# Patient Record
Sex: Female | Born: 2000 | Race: White | Hispanic: No | Smoking: Never smoker
Health system: Southern US, Community
[De-identification: ages and names within clinical notes are randomized; demographics above are authoritative.]

## PROBLEM LIST (undated history)

## (undated) ENCOUNTER — Inpatient Hospital Stay: Payer: Self-pay

## (undated) DIAGNOSIS — J45909 Unspecified asthma, uncomplicated: Secondary | ICD-10-CM

## (undated) HISTORY — PX: NO PAST SURGERIES: SHX2092

## (undated) HISTORY — DX: Unspecified asthma, uncomplicated: J45.909

---
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
## 2021-04-10 ENCOUNTER — Emergency Department
Admission: EM | Admit: 2021-04-10 | Discharge: 2021-04-10 | Disposition: A | Discharge status: Home / Self Care | Payer: Medicaid Other | Attending: Emergency Medicine | Admitting: Emergency Medicine

## 2021-04-10 ENCOUNTER — Emergency Department: Payer: Medicaid Other

## 2021-04-10 ENCOUNTER — Other Ambulatory Visit: Payer: Self-pay

## 2021-04-10 DIAGNOSIS — S161XXA Strain of muscle, fascia and tendon at neck level, initial encounter: Secondary | ICD-10-CM | POA: Insufficient documentation

## 2021-04-10 DIAGNOSIS — Z91040 Latex allergy status: Secondary | ICD-10-CM | POA: Insufficient documentation

## 2021-04-10 DIAGNOSIS — S0990XA Unspecified injury of head, initial encounter: Secondary | ICD-10-CM | POA: Diagnosis not present

## 2021-04-10 DIAGNOSIS — Y92410 Unspecified street and highway as the place of occurrence of the external cause: Secondary | ICD-10-CM | POA: Diagnosis not present

## 2021-04-10 DIAGNOSIS — S39012A Strain of muscle, fascia and tendon of lower back, initial encounter: Secondary | ICD-10-CM | POA: Insufficient documentation

## 2021-04-10 DIAGNOSIS — S199XXA Unspecified injury of neck, initial encounter: Secondary | ICD-10-CM | POA: Diagnosis present

## 2021-04-10 MED ORDER — IBUPROFEN 600 MG PO TABS: 600.0000 mg | ORAL_TABLET | Freq: Four times a day (QID) | ORAL | 0 refills | Status: DC | PRN

## 2021-04-10 MED ORDER — CYCLOBENZAPRINE HCL 5 MG PO TABS: 5.0000 mg | ORAL_TABLET | Freq: Three times a day (TID) | ORAL | 0 refills | Status: DC | PRN

## 2021-04-10 MED ORDER — METHOCARBAMOL 500 MG PO TABS
500.0000 mg | ORAL_TABLET | Freq: Once | ORAL | Status: AC
  Administered 2021-04-10: 500 mg via ORAL
  Filled 2021-04-10: qty 1

## 2021-04-10 MED ORDER — ACETAMINOPHEN 500 MG PO TABS
1000.0000 mg | ORAL_TABLET | Freq: Once | ORAL | Status: AC
  Administered 2021-04-10: 1000 mg via ORAL
  Filled 2021-04-10: qty 2
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
## 2021-04-10 NOTE — ED Notes (Signed)
Pt returned from radiology.

## 2021-04-10 NOTE — ED Notes (Signed)
Follow up pcp info provided alll questions answered , rx sent to pharm inof provided

## 2021-04-10 NOTE — Discharge Instructions (Addendum)
Please take ibuprofen and Tylenol for mild to moderate pain.  You may use muscle relaxer as needed for muscle tightness.  Apply ice to the neck and lower back muscles 20 minutes every hour for the next 2 to 3 days then you may transition to heat.  Return to the ER for any worsening symptoms or urgent changes in health.

## 2021-09-02 ENCOUNTER — Other Ambulatory Visit: Payer: Self-pay

## 2021-09-02 ENCOUNTER — Emergency Department
Admission: EM | Admit: 2021-09-02 | Discharge: 2021-09-02 | Disposition: A | Discharge status: Home / Self Care | Payer: Medicaid Other | Attending: Emergency Medicine | Admitting: Emergency Medicine

## 2021-09-02 ENCOUNTER — Emergency Department: Payer: Medicaid Other

## 2021-09-02 DIAGNOSIS — R519 Headache, unspecified: Secondary | ICD-10-CM | POA: Insufficient documentation

## 2021-09-02 DIAGNOSIS — D72829 Elevated white blood cell count, unspecified: Secondary | ICD-10-CM | POA: Insufficient documentation

## 2021-09-02 DIAGNOSIS — Z20822 Contact with and (suspected) exposure to covid-19: Secondary | ICD-10-CM | POA: Insufficient documentation

## 2021-09-02 DIAGNOSIS — E860 Dehydration: Secondary | ICD-10-CM | POA: Diagnosis not present

## 2021-09-02 DIAGNOSIS — R509 Fever, unspecified: Secondary | ICD-10-CM | POA: Diagnosis present

## 2021-09-02 DIAGNOSIS — J209 Acute bronchitis, unspecified: Secondary | ICD-10-CM | POA: Insufficient documentation

## 2021-09-02 LAB — CBC WITH DIFFERENTIAL/PLATELET
Abs Immature Granulocytes: 0.03 10*3/uL (ref 0.00–0.07)
Basophils Absolute: 0 10*3/uL (ref 0.0–0.1)
Basophils Relative: 0 %
Eosinophils Absolute: 0 10*3/uL (ref 0.0–0.5)
Eosinophils Relative: 0 %
HCT: 46.1 % — ABNORMAL HIGH (ref 36.0–46.0)
Hemoglobin: 15.4 g/dL — ABNORMAL HIGH (ref 12.0–15.0)
Immature Granulocytes: 0 %
Lymphocytes Relative: 5 %
Lymphs Abs: 0.7 10*3/uL (ref 0.7–4.0)
MCH: 30.4 pg (ref 26.0–34.0)
MCHC: 33.4 g/dL (ref 30.0–36.0)
MCV: 91.1 fL (ref 80.0–100.0)
Monocytes Absolute: 0.8 10*3/uL (ref 0.1–1.0)
Monocytes Relative: 7 %
Neutro Abs: 11.3 10*3/uL — ABNORMAL HIGH (ref 1.7–7.7)
Neutrophils Relative %: 88 %
Platelets: 236 10*3/uL (ref 150–400)
RBC: 5.06 MIL/uL (ref 3.87–5.11)
RDW: 12.6 % (ref 11.5–15.5)
WBC: 12.8 10*3/uL — ABNORMAL HIGH (ref 4.0–10.5)
nRBC: 0 % (ref 0.0–0.2)

## 2021-09-02 LAB — BASIC METABOLIC PANEL
Anion gap: 13 (ref 5–15)
BUN: 13 mg/dL (ref 6–20)
CO2: 22 mmol/L (ref 22–32)
Calcium: 9.4 mg/dL (ref 8.9–10.3)
Chloride: 98 mmol/L (ref 98–111)
Creatinine, Ser: 0.61 mg/dL (ref 0.44–1.00)
GFR, Estimated: >60 mL/min (ref >60)
Glucose, Bld: 84 mg/dL (ref 70–99)
Potassium: 3.6 mmol/L (ref 3.5–5.1)
Sodium: 133 mmol/L — ABNORMAL LOW (ref 135–145)

## 2021-09-02 LAB — URINALYSIS, ROUTINE W REFLEX MICROSCOPIC
Bilirubin Urine: NEGATIVE
Glucose, UA: NEGATIVE mg/dL
Hgb urine dipstick: NEGATIVE
Ketones, ur: 80 mg/dL — AB
Leukocytes,Ua: NEGATIVE
Nitrite: NEGATIVE
Protein, ur: NEGATIVE mg/dL
Specific Gravity, Urine: 1.023 (ref 1.005–1.030)
pH: 5 (ref 5.0–8.0)

## 2021-09-02 LAB — RESP PANEL BY RT-PCR (FLU A&B, COVID) ARPGX2
Influenza A by PCR: NEGATIVE
Influenza B by PCR: NEGATIVE
SARS Coronavirus 2 by RT PCR: NEGATIVE

## 2021-09-02 LAB — GROUP A STREP BY PCR: Group A Strep by PCR: NOT DETECTED

## 2021-09-02 LAB — MONONUCLEOSIS SCREEN: Mono Screen: NEGATIVE

## 2021-09-02 MED ORDER — ONDANSETRON 4 MG PO TBDP: 4.0000 mg | ORAL_TABLET | Freq: Three times a day (TID) | ORAL | 0 refills | Status: DC | PRN

## 2021-09-02 MED ORDER — ONDANSETRON HCL 4 MG/2ML IJ SOLN
4.0000 mg | Freq: Once | INTRAMUSCULAR | Status: AC
  Administered 2021-09-02: 4 mg via INTRAVENOUS
  Filled 2021-09-02: qty 2

## 2021-09-02 MED ORDER — SODIUM CHLORIDE 0.9 % IV BOLUS
1000.0000 mL | Freq: Once | INTRAVENOUS | Status: AC
  Administered 2021-09-02: 1000 mL via INTRAVENOUS

## 2021-09-02 MED ORDER — AZITHROMYCIN 250 MG PO TABS: ORAL_TABLET | ORAL | 0 refills | Status: DC

## 2021-09-02 NOTE — ED Notes (Addendum)
Pt to ED for states she feels tired and dehydrated. Has been feeling weak with HA for 3 days. This morning at 0300 woke up and vomited. Has vomited 3 times this morning. Has had diarrhea for 2 days. Had negative home covid test yesterday. Pt took T yesterday, was 101.2.  ?Ambulatory to ED room. Pulse is 118 after resting in bed. Was 130 right after walking to room. ? ?Pt states she also wants to get "checked out" for chest tightness when lays supine ("it hurts to breathe"). ?

## 2021-09-02 NOTE — ED Notes (Signed)
Patient transported to X-ray 

## 2021-09-02 NOTE — ED Provider Notes (Signed)
? ?[facility] ?Provider Note ? ? ? Event Date/Time  ? First MD Initiated Contact with Patient 09/02/21 0840   ?  (approximate) ? ? ?History  ? ?Fever ? ? ?HPI ? ?Kim Hawkins is a 21 y.o. female healthy female presents emergency department with fever, chills, cough/congestion/sore throat and vomiting.  Patient states she feels dehydrated.  Has a headache.  Symptoms have been ongoing for 3 days.  Had negative home COVID test.  Denies chest pain/shortness of breath ? ?  ? ? ?Physical Exam  ? ?Triage Vital Signs: ?ED Triage Vitals  ?Enc Vitals Group  ?   BP 09/02/21 0835 130/82  ?   Pulse Rate 09/02/21 0835 (!) 120  ?   Resp 09/02/21 0835 18  ?   Temp 09/02/21 0835 100 ?F (37.8 ?C)  ?   Temp Source 09/02/21 0835 Oral  ?   SpO2 09/02/21 0835 98 %  ?   Weight --   ?   Height --   ?   Head Circumference --   ?   Peak Flow --   ?   Pain Score 09/02/21 0828 10  ?   Pain Loc --   ?   Pain Edu? --   ?   Excl. in GC? --   ? ? ?Most recent vital signs: ?Vitals:  ? 09/02/21 0930 09/02/21 1119  ?BP:  116/66  ?Pulse: (!) 110 (!) 116  ?Resp: 18 16  ?Temp:  98.9 ?F (37.2 ?C)  ?SpO2: 98% 99%  ? ? ? ?General: Awake, no distress.   ?CV:  Good peripheral perfusion. regular rate and  rhythm ?Resp:  Normal effort. Lungs CTA ?Abd:  No distention.   ?Other:  Throat is red and swollen posteriorly ? ? ?ED Results / Procedures / Treatments  ? ?Labs ?(all labs ordered are listed, but only abnormal results are displayed) ?Labs Reviewed  ?BASIC METABOLIC PANEL - Abnormal; Notable for the following components:  ?    Result Value  ? Sodium 133 (*)   ? All other components within normal limits  ?CBC WITH DIFFERENTIAL/PLATELET - Abnormal; Notable for the following components:  ? WBC 12.8 (*)   ? Hemoglobin 15.4 (*)   ? HCT 46.1 (*)   ? Neutro Abs 11.3 (*)   ? All other components within normal limits  ?URINALYSIS, ROUTINE W REFLEX MICROSCOPIC - Abnormal; Notable for the following components:  ? Color, Urine YELLOW (*)   ?  APPearance HAZY (*)   ? Ketones, ur 80 (*)   ? All other components within normal limits  ?GROUP A STREP BY PCR  ?RESP PANEL BY RT-PCR (FLU A&B, COVID) [id number]  ?MONONUCLEOSIS SCREEN  ? ? ? ?EKG ? ? ? ? ?RADIOLOGY ? ? ? ? ?PROCEDURES: ? ? ?Procedures ? ? ?MEDICATIONS ORDERED IN ED: ?Medications  ?sodium chloride 0.9 % bolus 1,000 mL (0 mLs Intravenous Stopped 09/02/21 1029)  ?ondansetron [facility]) injection 4 mg (4 mg Intravenous Given 09/02/21 0857)  ?sodium chloride 0.9 % bolus 1,000 mL (0 mLs Intravenous Stopped 09/02/21 1311)  ? ? ? ?IMPRESSION / MDM / ASSESSMENT AND PLAN / ED COURSE  ?I reviewed the triage vital signs and the nursing notes. ?             ?               ? ?Differential diagnosis includes, but is not limited to, strep throat, COVID covid influenza covid CAP, dehydration, gastroenteritis ? ?  CBC, basic metabolic panel, COVID/flu test, strep test ordered, UA ? ?Due to the patient being tachycardic at 126 we will give her IV fluids. ? ?Patient's labs are reassuring, her CBC has mild elevation of WBC of 12.8, basic metabolic panel was normal, urinalysis has 80 ketones indicating dehydration, her respiratory panel, strep test, and monotest are all negative ? ?Patient was given 2 bags of normal saline while here in the ED.  She will be treated for acute bronchitis and dehydration.  She was given a prescription for azithromycin and Zofran for nausea/vomiting.  She is to follow-up with her regular doctor if not improved 3 days.  Return emergency department worsening.  Patient is in agreement treatment plan.  She was discharged stable condition.  She does not have any risk factors that prevent me from discharging her.  She appears to be stable as her heart rate has returned to normal. ? ? ? ?  ? ? ?FINAL CLINICAL IMPRESSION(S) / ED DIAGNOSES  ? ?Final diagnoses:  ?Acute bronchitis, unspecified organism  ?Dehydration  ? ? ? ?Rx / DC Orders  ? ?ED Discharge Orders   ? ?      Ordered  ?  azithromycin (ZITHROMAX  Z-PAK) 250 MG tablet       ? 09/02/21 1302  ?  ondansetron (ZOFRAN-ODT) 4 MG disintegrating tablet  Every 8 hours PRN       ? 09/02/21 1302  ? ?  ?  ? ?  ? ? ? ?Note:  This document was prepared using Dragon voice recognition software and may include unintentional dictation errors. ? ?  ?Faythe Ghee, PA-C ?09/02/21 1441 ? ?  ?Sharyn Creamer, MD ?09/02/21 1533 ? ?

## 2021-09-02 NOTE — Discharge Instructions (Signed)
Follow-up with your regular doctor for improving 3 days.  Return if worsening.  Take medication as prescribed. ?

## 2021-09-02 NOTE — ED Triage Notes (Signed)
Pt comes with c/o fever chills and body aches for few days. Pt states neg covid test at home. ?

## 2021-09-02 NOTE — ED Notes (Signed)
Reminded about urine sample needs ?

## 2021-09-02 NOTE — ED Notes (Signed)
Back from X/R.

## 2022-01-10 ENCOUNTER — Encounter: Payer: Self-pay | Admitting: Emergency Medicine

## 2022-01-10 ENCOUNTER — Emergency Department
Admission: EM | Admit: 2022-01-10 | Discharge: 2022-01-10 | Discharge status: Against Medical Advice | Payer: Medicaid Other | Attending: Emergency Medicine | Admitting: Emergency Medicine

## 2022-01-10 ENCOUNTER — Other Ambulatory Visit: Payer: Self-pay

## 2022-01-10 DIAGNOSIS — R21 Rash and other nonspecific skin eruption: Secondary | ICD-10-CM | POA: Diagnosis present

## 2022-01-10 DIAGNOSIS — R2232 Localized swelling, mass and lump, left upper limb: Secondary | ICD-10-CM | POA: Diagnosis not present

## 2022-01-10 DIAGNOSIS — Z5321 Procedure and treatment not carried out due to patient leaving prior to being seen by health care provider: Secondary | ICD-10-CM | POA: Insufficient documentation

## 2022-01-10 DIAGNOSIS — F909 Attention-deficit hyperactivity disorder, unspecified type: Secondary | ICD-10-CM | POA: Insufficient documentation

## 2022-01-10 NOTE — ED Triage Notes (Signed)
Pt called x's 3, no response ?

## 2022-01-10 NOTE — ED Triage Notes (Signed)
Pt reports washed her car today and when she was done she had some swelling to her left arm. Pt with 3 small red bumps to left forearm.

## 2022-01-10 NOTE — ED Triage Notes (Signed)
Pt called from [facility] to treatment room, no response 

## 2022-01-12 ENCOUNTER — Ambulatory Visit
Admission: EM | Admit: 2022-01-12 | Discharge: 2022-01-12 | Disposition: A | Discharge status: Home / Self Care | Payer: Medicaid Other | Attending: Emergency Medicine | Admitting: Emergency Medicine

## 2022-01-12 DIAGNOSIS — N390 Urinary tract infection, site not specified: Secondary | ICD-10-CM | POA: Diagnosis present

## 2022-01-12 LAB — URINALYSIS, ROUTINE W REFLEX MICROSCOPIC
Bilirubin Urine: NEGATIVE
Glucose, UA: NEGATIVE mg/dL
Ketones, ur: NEGATIVE mg/dL
Nitrite: NEGATIVE
Protein, ur: NEGATIVE mg/dL
Specific Gravity, Urine: 1.015 (ref 1.005–1.030)
pH: 7.5 (ref 5.0–8.0)

## 2022-01-12 LAB — URINALYSIS, MICROSCOPIC (REFLEX): WBC, UA: >50 WBC/hpf (ref 0–5)

## 2022-01-12 MED ORDER — NITROFURANTOIN MONOHYD MACRO 100 MG PO CAPS: 100.0000 mg | ORAL_CAPSULE | Freq: Two times a day (BID) | ORAL | 0 refills | Status: DC

## 2022-01-12 MED ORDER — PHENAZOPYRIDINE HCL 200 MG PO TABS: 200.0000 mg | ORAL_TABLET | Freq: Three times a day (TID) | ORAL | 0 refills | Status: DC

## 2022-01-12 NOTE — ED Provider Notes (Signed)
[facility]    CSN: [id number] Arrival date & time: 01/12/22  1305      History   Chief Complaint Chief Complaint  Patient presents with   Urinary Tract Infection    HPI Kim Hawkins is a 21 y.o. female.   HPI  21 year old female here for evaluation of urinary complaints.  Patient reports that for the past week she has been experiencing burning with urination and urinary frequency.  She is also been having sharp pain in her abdomen that goes down to her groin.  This is not associated with fever, nausea or vomiting, blood in her urine, urinary urgency, low back pain, vaginal discharge, or vaginal itching.  History reviewed. No pertinent past medical history.  There are no problems to display for this patient.   History reviewed. No pertinent surgical history.  OB History     Gravida  1   Para      Term      Preterm      AB      Living         SAB      IAB      Ectopic      Multiple      Live Births               Home Medications    Prior to Admission medications   Medication Sig Start Date End Date Taking? Authorizing Provider  albuterol (VENTOLIN HFA) 108 (90 Base) MCG/ACT inhaler Inhale into the lungs. 01/26/21  Yes [provider]  fluticasone-salmeterol (ADVAIR HFA) 115-21 MCG/ACT inhaler Inhale into the lungs. 01/21/21 01/21/22 Yes [provider]  nitrofurantoin, macrocrystal-monohydrate, (MACROBID) 100 MG capsule Take 1 capsule (100 mg total) by mouth 2 (two) times daily. 01/12/22  Yes Becky Augusta, NP  phenazopyridine (PYRIDIUM) 200 MG tablet Take 1 tablet (200 mg total) by mouth 3 (three) times daily. 01/12/22  Yes Becky Augusta, NP    Family History History reviewed. No pertinent family history.  Social History Social History   Tobacco Use   Smoking status: Never   Smokeless tobacco: Never  Vaping Use   Vaping Use: Every day  Substance Use Topics   Alcohol use: No   Drug use: No     Allergies    Penicillins, Cocos nucifera, and Latex   Review of Systems Review of Systems  Constitutional:  Negative for fever.  Gastrointestinal:  Positive for abdominal pain. Negative for nausea and vomiting.  Genitourinary:  Positive for dysuria and frequency. Negative for hematuria, urgency, vaginal discharge and vaginal pain.  Musculoskeletal:  Negative for back pain.  Hematological: Negative.   Psychiatric/Behavioral: Negative.       Physical Exam Triage Vital Signs ED Triage Vitals  Enc Vitals Group     BP 01/12/22 1357 111/71     Pulse Rate 01/12/22 1357 75     Resp 01/12/22 1357 18     Temp 01/12/22 1357 98.5 F (36.9 C)     Temp Source 01/12/22 1357 Oral     SpO2 01/12/22 1357 100 %     Weight 01/12/22 1354 90 lb 6.4 oz (41 kg)     Height --      Head Circumference --      Peak Flow --      Pain Score 01/12/22 1354 6     Pain Loc --      Pain Edu? --      Excl. in GC? --  No data found.  Updated Vital Signs BP 111/71 (BP Location: Left Arm)   Pulse 75   Temp 98.5 F (36.9 C) (Oral)   Resp 18   Wt 90 lb 6.4 oz (41 kg)   LMP 01/12/2022 (Exact Date)   SpO2 100%   BMI 17.66 kg/m   Visual Acuity Right Eye Distance:   Left Eye Distance:   Bilateral Distance:    Right Eye Near:   Left Eye Near:    Bilateral Near:     Physical Exam Vitals and nursing note reviewed.  Constitutional:      Appearance: Normal appearance. She is not ill-appearing.  HENT:     Head: Normocephalic and atraumatic.  Cardiovascular:     Rate and Rhythm: Normal rate and regular rhythm.     Pulses: Normal pulses.     Heart sounds: Normal heart sounds. No murmur heard.    No friction rub. No gallop.  Pulmonary:     Effort: Pulmonary effort is normal.     Breath sounds: Normal breath sounds. No wheezing, rhonchi or rales.  Abdominal:     General: Abdomen is flat. Bowel sounds are normal.     Palpations: Abdomen is soft.     Tenderness: There is abdominal tenderness. There is no  right CVA tenderness, left CVA tenderness, guarding or rebound.  Skin:    General: Skin is warm and dry.     Capillary Refill: Capillary refill takes less than 2 seconds.     Findings: No erythema or rash.  Neurological:     General: No focal deficit present.     Mental Status: She is alert and oriented to person, place, and time.  Psychiatric:        Mood and Affect: Mood normal.        Behavior: Behavior normal.        Thought Content: Thought content normal.        Judgment: Judgment normal.      UC Treatments / Results  Labs (all labs ordered are listed, but only abnormal results are displayed) Labs Reviewed  URINALYSIS, ROUTINE W REFLEX MICROSCOPIC - Abnormal; Notable for the following components:      Result Value   APPearance HAZY (*)    Hgb urine dipstick SMALL (*)    Leukocytes,Ua LARGE (*)    All other components within normal limits  URINALYSIS, MICROSCOPIC (REFLEX) - Abnormal; Notable for the following components:   Bacteria, UA MANY (*)    All other components within normal limits  URINE CULTURE    EKG   Radiology No results found.  Procedures Procedures (including critical care time)  Medications Ordered in UC Medications - No data to display  Initial Impression / Assessment and Plan / UC Course  I have reviewed the triage vital signs and the nursing notes.  Pertinent labs & imaging results that were available during my care of the patient were reviewed by me and considered in my medical decision making (see chart for details).  Patient is a pleasant, nontoxic-appearing 21 year old female here for evaluation of abdominal pain that is associated with burning with urination and urinary frequency has been going on for the past week.  No hematuria, nausea or vomiting, or fever.  Patient denies any vaginal discharge or itching.  Physical exam reveals a benign cardiopulmonary exam revealing S1-S2 heart sounds with regular rate and rhythm and lung sounds that  are clear to auscultation all fields.  No CVA tenderness on exam.  Abdomen is soft, flat, with positive bowel sounds in all 4 quadrants.  Patient does have significant midline abdominal tenderness in both the epigastrium and suprapubically.  Urinalysis was collected at triage and is pending.  Urinalysis shows small hemoglobin and large leukocyte esterase.  It is negative for nitrates, or protein.  Specific Rampey is normal at 1.015.  Reflex microscopy shows greater than 50 WBCs with many bacteria and WBC clumps.  Patient does have 6-10 squamous epithelials.  I will send urine for culture.   Final Clinical Impressions(s) / UC Diagnoses   Final diagnoses:  Lower urinary tract infectious disease     Discharge Instructions      Take the Macrobid twice daily for 5 days with food for treatment of urinary tract infection.  Use the Pyridium every 8 hours as needed for urinary discomfort.  This will turn your urine a bright red-orange.  Increase your oral fluid intake so that you increase your urine production and or flushing your urinary system.  Take an over-the-counter probiotic, such as Culturelle-Align-Activia, 1 hour after each dose of antibiotic to prevent diarrhea or yeast infections from forming.  We will culture urine and change the antibiotics if necessary.  If you develop worsening abdominal pain or nausea and vomiting and cannot keep down medications or fluids you need to go to the emergency department for evaluation.     ED Prescriptions     Medication Sig Dispense Auth. Provider   nitrofurantoin, macrocrystal-monohydrate, (MACROBID) 100 MG capsule Take 1 capsule (100 mg total) by mouth 2 (two) times daily. 10 capsule Margarette Canada, NP   phenazopyridine (PYRIDIUM) 200 MG tablet Take 1 tablet (200 mg total) by mouth 3 (three) times daily. 6 tablet Margarette Canada, NP      PDMP not reviewed this encounter.   Margarette Canada, NP 01/12/22 1425

## 2022-01-12 NOTE — Discharge Instructions (Addendum)
Take the Macrobid twice daily for 5 days with food for treatment of urinary tract infection.  Use the Pyridium every 8 hours as needed for urinary discomfort.  This will turn your urine a bright red-orange.  Increase your oral fluid intake so that you increase your urine production and or flushing your urinary system.  Take an over-the-counter probiotic, such as Culturelle-Align-Activia, 1 hour after each dose of antibiotic to prevent diarrhea or yeast infections from forming.  We will culture urine and change the antibiotics if necessary.  If you develop worsening abdominal pain or nausea and vomiting and cannot keep down medications or fluids you need to go to the emergency department for evaluation.

## 2022-01-12 NOTE — ED Triage Notes (Signed)
Pt c/o possible UTI x1week  Pt states that it feels like her bladder is 100lbs. Pt states that she is constantly moving at work and it has left her doubled over due to abdominal pain.   Pt states that she is having sharp pain in her abdomen that goes to the groin and burning while urinating.   Pt asks for a doctors note.

## 2022-01-15 ENCOUNTER — Telehealth (HOSPITAL_COMMUNITY): Payer: Self-pay | Admitting: Emergency Medicine

## 2022-01-15 LAB — URINE CULTURE: Culture: >=100000 — AB

## 2022-01-15 MED ORDER — SULFAMETHOXAZOLE-TRIMETHOPRIM 800-160 MG PO TABS: 1.0000 | ORAL_TABLET | Freq: Two times a day (BID) | ORAL | 0 refills | Status: AC
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
## 2023-05-24 ENCOUNTER — Emergency Department
Admission: EM | Admit: 2023-05-24 | Discharge: 2023-05-24 | Disposition: A | Discharge status: Home / Self Care | Payer: Medicaid Other | Attending: Emergency Medicine | Admitting: Emergency Medicine

## 2023-05-24 ENCOUNTER — Other Ambulatory Visit: Payer: Self-pay

## 2023-05-24 DIAGNOSIS — Z3A00 Weeks of gestation of pregnancy not specified: Secondary | ICD-10-CM | POA: Diagnosis not present

## 2023-05-24 DIAGNOSIS — O219 Vomiting of pregnancy, unspecified: Secondary | ICD-10-CM | POA: Insufficient documentation

## 2023-05-24 LAB — URINALYSIS, ROUTINE W REFLEX MICROSCOPIC
Bilirubin Urine: NEGATIVE
Glucose, UA: NEGATIVE mg/dL
Hgb urine dipstick: NEGATIVE
Ketones, ur: 80 mg/dL — AB
Leukocytes,Ua: NEGATIVE
Nitrite: NEGATIVE
Protein, ur: 30 mg/dL — AB
Specific Gravity, Urine: 1.025 (ref 1.005–1.030)
pH: 5 (ref 5.0–8.0)

## 2023-05-24 LAB — POC URINE PREG, ED: Preg Test, Ur: POSITIVE — AB

## 2023-05-24 MED ORDER — ONDANSETRON 4 MG PO TBDP
4.0000 mg | ORAL_TABLET | Freq: Once | ORAL | Status: AC
  Administered 2023-05-24: 4 mg via ORAL
  Filled 2023-05-24: qty 1

## 2023-05-24 MED ORDER — ONDANSETRON HCL 4 MG PO TABS: 4.0000 mg | ORAL_TABLET | Freq: Four times a day (QID) | ORAL | 0 refills | Status: AC | PRN

## 2023-05-24 NOTE — ED Notes (Signed)
Lab specimens collected in Error. Pt refusing blood draw and sts " I am not getting stuck by a needle" RN notified provider.

## 2023-05-24 NOTE — Discharge Instructions (Addendum)
You were seen for your vomiting in pregnancy.  I sent a prescription for nausea medicine to your pharmacy.  You did not wish to have lead work performed today.  Please follow-up with OB/GYN for further evaluation.  Return to the ER for new or worsening symptoms.

## 2023-05-24 NOTE — ED Notes (Addendum)
Pt requesting to wait on blood draw until she is in room. Pt states she gets nervous when getting it done and wants to wait.

## 2023-05-24 NOTE — ED Triage Notes (Signed)
Pt comes with c/o vomiting that started at 3am. Pt states she is preg but not sure how far along. Pt denies any bleeding or belly pain.

## 2023-05-24 NOTE — ED Provider Notes (Signed)
[facility] Provider Note    Event Date/Time   First MD Initiated Contact with Patient 05/24/23 1235     (approximate)   History   Emesis   HPI  Kim Hawkins is a 22 year old female presenting to the emergency department for evaluation of vomiting.  Reports positive home pregnancy test, unsure how far along she is.  She denies abdominal pain, vaginal bleeding.     Physical Exam   Triage Vital Signs: ED Triage Vitals  Encounter Vitals Group     BP 05/24/23 0838 131/87     Systolic BP Percentile --      Diastolic BP Percentile --      Pulse Rate 05/24/23 0838 (!) 120     Resp 05/24/23 0838 18     Temp 05/24/23 0838 98 F (36.7 C)     Temp src --      SpO2 05/24/23 0838 98 %     Weight 05/24/23 0836 110 lb (49.9 kg)     Height 05/24/23 0836 5\' 2"  (1.575 m)     Head Circumference --      Peak Flow --      Pain Score 05/24/23 0836 0     Pain Loc --      Pain Education --      Exclude from Growth Chart --     Most recent vital signs: Vitals:   05/24/23 0838 05/24/23 1240  BP: 131/87 128/80  Pulse: (!) 120 99  Resp: 18 18  Temp: 98 F (36.7 C) 98 F (36.7 C)  SpO2: 98% 98%     General: Awake, interactive  CV:  Regular rate, good peripheral perfusion.  Resp:  Unlabored respirations Abd:  Nondistended, no significant tenderness to palpation Neuro:  Symmetric facial movement, fluid speech   ED Results / Procedures / Treatments   Labs (all labs ordered are listed, but only abnormal results are displayed) Labs Reviewed  URINALYSIS, ROUTINE W REFLEX MICROSCOPIC - Abnormal; Notable for the following components:      Result Value   Color, Urine YELLOW (*)    APPearance HAZY (*)    Ketones, ur 80 (*)    Protein, ur 30 (*)    Bacteria, UA RARE (*)    All other components within normal limits  POC URINE PREG, ED - Abnormal; Notable for the following components:   Preg Test, Ur Positive (*)    All other components within normal  limits  LIPASE, BLOOD  COMPREHENSIVE METABOLIC PANEL  CBC     EKG EKG independently reviewed interpreted by myself (ER attending) demonstrates:    RADIOLOGY Imaging independently reviewed and interpreted by myself demonstrates:    PROCEDURES:  Critical Care performed: No  Procedures   MEDICATIONS ORDERED IN ED: Medications  ondansetron (ZOFRAN-ODT) disintegrating tablet 4 mg (4 mg Oral Given 05/24/23 0843)     IMPRESSION / MDM / ASSESSMENT AND PLAN / ED COURSE  I reviewed the triage vital signs and the nursing notes.  Differential diagnosis includes, but is not limited to, vomiting in pregnancy, hyperemesis gravidarum, electrolyte abnormality, ectopic pregnancy  Patient's presentation is most consistent with acute presentation with potential threat to life or bodily function.  22 year old female presenting with vomiting in pregnancy.  Tachycardic on presentation, improved at the time of my initial evaluation when roomed a few hours later.  Urine with ketones, reflective of dehydration.  UPT positive.  Patient refused blood work multiple times.  She initially requested an  ultrasound.  I did discuss that our radiology protocol requires a beta-hCG in order to facilitate interpretation.  She continued to refuse blood work and now states that she does not wish to stay for an ultrasound.  She was given oral Zofran and was tolerating p.o. following this.  I did recommend IV fluids given her degree of dehydration.  Patient declined this repeatedly.  She understands that this is AGAINST MEDICAL ADVICE.  She does demonstrate Medical Decision Making capacity.  She was given an open invitation to return.  She was given a prescription for Zofran as well as information for follow-up with OB/GYN.  Patient discharged with strict return precautions.  The patient wants leave against medical advise. I spoke with the patient extensively regarding the risks of leaving prior to completion of an  appropriate workup and interventions given their symptomatology. We discussed possibility of significant morbidity and death that may occur as a result of them leaving. The patient expressed understanding of the situation and items mentioned, however would like to leave our care.   I have determined that the patient has the capacity for this medical decision. I have discussed the risks and benefits of the proposed treatment and treatment alternatives including non-treatment. I have offered an open invitation to the patient to return at any time for care. I have determined that the patient understands this discussion and that the patient willingly assumes the potential risk to their well-being as a result of this informed refusal.    FINAL CLINICAL IMPRESSION(S) / ED DIAGNOSES   Final diagnoses:  Vomiting during pregnancy     Rx / DC Orders   ED Discharge Orders          Ordered    ondansetron (ZOFRAN) 4 MG tablet  Every 6 hours PRN        05/24/23 1317             Note:  This document was prepared using Dragon voice recognition software and may include unintentional dictation errors.   Trinna Post, MD 05/24/23 1322

## 2023-06-06 ENCOUNTER — Emergency Department
Admission: EM | Admit: 2023-06-06 | Discharge: 2023-06-06 | Disposition: A | Discharge status: Home / Self Care | Payer: Medicaid Other | Attending: Emergency Medicine | Admitting: Emergency Medicine

## 2023-06-06 ENCOUNTER — Other Ambulatory Visit: Payer: Self-pay

## 2023-06-06 ENCOUNTER — Encounter: Payer: Self-pay | Admitting: *Deleted

## 2023-06-06 DIAGNOSIS — O99611 Diseases of the digestive system complicating pregnancy, first trimester: Secondary | ICD-10-CM | POA: Diagnosis present

## 2023-06-06 DIAGNOSIS — K5900 Constipation, unspecified: Secondary | ICD-10-CM | POA: Diagnosis not present

## 2023-06-06 LAB — CBC
HCT: 33 % — ABNORMAL LOW (ref 36.0–46.0)
Hemoglobin: 11.4 g/dL — ABNORMAL LOW (ref 12.0–15.0)
MCH: 31.8 pg (ref 26.0–34.0)
MCHC: 34.5 g/dL (ref 30.0–36.0)
MCV: 91.9 fL (ref 80.0–100.0)
Platelets: 311 10*3/uL (ref 150–400)
RBC: 3.59 MIL/uL — ABNORMAL LOW (ref 3.87–5.11)
RDW: 12.3 % (ref 11.5–15.5)
WBC: 17.7 10*3/uL — ABNORMAL HIGH (ref 4.0–10.5)
nRBC: 0 % (ref 0.0–0.2)

## 2023-06-06 LAB — COMPREHENSIVE METABOLIC PANEL
ALT: 16 U/L (ref 0–44)
AST: 15 U/L (ref 15–41)
Albumin: 3.9 g/dL (ref 3.5–5.0)
Alkaline Phosphatase: 39 U/L (ref 38–126)
Anion gap: 10 (ref 5–15)
BUN: 9 mg/dL (ref 6–20)
CO2: 19 mmol/L — ABNORMAL LOW (ref 22–32)
Calcium: 8.7 mg/dL — ABNORMAL LOW (ref 8.9–10.3)
Chloride: 102 mmol/L (ref 98–111)
Creatinine, Ser: 0.43 mg/dL — ABNORMAL LOW (ref 0.44–1.00)
GFR, Estimated: >60 mL/min (ref >60)
Glucose, Bld: 114 mg/dL — ABNORMAL HIGH (ref 70–99)
Potassium: 3.6 mmol/L (ref 3.5–5.1)
Sodium: 131 mmol/L — ABNORMAL LOW (ref 135–145)
Total Bilirubin: 0.6 mg/dL (ref <1.2)
Total Protein: 7.4 g/dL (ref 6.5–8.1)

## 2023-06-06 LAB — LIPASE, BLOOD: Lipase: 23 U/L (ref 11–51)

## 2023-06-06 LAB — HCG, QUANTITATIVE, PREGNANCY: hCG, Beta Chain, Quant, S: >250000 m[IU]/mL — ABNORMAL HIGH

## 2023-06-06 MED ORDER — BISACODYL 10 MG RE SUPP: 10.0000 mg | RECTAL | 0 refills | Status: DC | PRN

## 2023-06-06 MED ORDER — ONDANSETRON 4 MG PO TBDP
4.0000 mg | ORAL_TABLET | Freq: Once | ORAL | Status: AC
  Administered 2023-06-06: 4 mg via ORAL
  Filled 2023-06-06: qty 1

## 2023-06-06 MED ORDER — ONDANSETRON 4 MG PO TBDP: 4.0000 mg | ORAL_TABLET | Freq: Three times a day (TID) | ORAL | 0 refills | Status: DC | PRN

## 2023-06-06 MED ORDER — ACETAMINOPHEN 325 MG PO TABS: 650.0000 mg | ORAL_TABLET | Freq: Once | ORAL | Status: DC

## 2023-06-06 MED ORDER — POLYETHYLENE GLYCOL 3350 17 G PO PACK
17.0000 g | PACK | Freq: Once | ORAL | Status: AC
  Administered 2023-06-06: 17 g via ORAL

## 2023-06-06 MED ORDER — POLYETHYLENE GLYCOL 3350 17 G PO PACK: 17.0000 g | PACK | Freq: Every day | ORAL | Status: DC

## 2023-06-06 NOTE — ED Triage Notes (Signed)
Pt to triage via wheelchair.  Pt reports constipation for 2 days.  Pt is pregnant.  No vaginal bleeding.  No urinary sx.  Pt alert.

## 2023-06-06 NOTE — ED Provider Notes (Signed)
[facility] Provider Note    Event Date/Time   First MD Initiated Contact with Patient 06/06/23 0840     (approximate)   History   Constipation   HPI  Kim Hawkins is a 22 y.o. female who reports she is pregnant in her first trimester, last menstrual cycle 2 months ago presents with complaints of constipation, she reports that she feels like stool is stuck in her rectum, no improvement with MiraLAX.  Last normal stool was 2 days ago     Physical Exam   Triage Vital Signs: ED Triage Vitals  Encounter Vitals Group     BP 06/06/23 0125 (!) 106/52     Systolic BP Percentile --      Diastolic BP Percentile --      Pulse Rate 06/06/23 0125 100     Resp 06/06/23 0125 18     Temp 06/06/23 0125 98.6 F (37 C)     Temp Source 06/06/23 0125 Oral     SpO2 06/06/23 0125 98 %     Weight 06/06/23 0122 47.6 kg (105 lb)     Height 06/06/23 0122 1.575 m (5\' 2" )     Head Circumference --      Peak Flow --      Pain Score 06/06/23 0122 10     Pain Loc --      Pain Education --      Exclude from Growth Chart --     Most recent vital signs: Vitals:   06/06/23 0600 06/06/23 0910  BP: 130/70 109/66  Pulse: 97 (!) 102  Resp: 16 16  Temp: 98.4 F (36.9 C) 98.4 F (36.9 C)  SpO2: 100% 99%     General: Awake, no distress.  CV:  Good peripheral perfusion.  Resp:  Normal effort.  Abd:  No distention.  Abdomen soft, nontender, nongravid appearance Other:     ED Results / Procedures / Treatments   Labs (all labs ordered are listed, but only abnormal results are displayed) Labs Reviewed  COMPREHENSIVE METABOLIC PANEL - Abnormal; Notable for the following components:      Result Value   Sodium 131 (*)    CO2 19 (*)    Glucose, Bld 114 (*)    Creatinine, Ser 0.43 (*)    Calcium 8.7 (*)    All other components within normal limits  CBC - Abnormal; Notable for the following components:   WBC 17.7 (*)    RBC 3.59 (*)    Hemoglobin 11.4 (*)    HCT  33.0 (*)    All other components within normal limits  HCG, QUANTITATIVE, PREGNANCY - Abnormal; Notable for the following components:   hCG, Beta Chain, Quant, S >250,000 (*)    All other components within normal limits  LIPASE, BLOOD     EKG     RADIOLOGY     PROCEDURES:  Critical Care performed:   Procedures   MEDICATIONS ORDERED IN ED: Medications  polyethylene glycol (MIRALAX / GLYCOLAX) packet 17 g (17 g Oral Given 06/06/23 0525)  ondansetron (ZOFRAN-ODT) disintegrating tablet 4 mg (4 mg Oral Given 06/06/23 0912)     IMPRESSION / MDM / ASSESSMENT AND PLAN / ED COURSE  I reviewed the triage vital signs and the nursing notes. Patient's presentation is most consistent with acute complicated illness / injury requiring diagnostic workup.  Patient presents with reports of constipation, her pain is isolated to her rectum, no abdominal pain, no vaginal pain  She feels as if stool is stuck in her rectum.  I recommended fecal disimpaction however she adamantly refused.  She would like to trial p.o. medications first, I did convince her to use Dulcolax suppositories, continue MiraLAX, close follow-up with GYN        FINAL CLINICAL IMPRESSION(S) / ED DIAGNOSES   Final diagnoses:  Constipation, unspecified constipation type     Rx / DC Orders   ED Discharge Orders          Ordered    ondansetron (ZOFRAN-ODT) 4 MG disintegrating tablet  Every 8 hours PRN        06/06/23 0859    bisacodyl (DULCOLAX) 10 MG suppository  As needed        06/06/23 1610             Note:  This document was prepared using Dragon voice recognition software and may include unintentional dictation errors.   Jene Every, MD 06/06/23 1016

## 2023-06-08 NOTE — L&D Delivery Note (Signed)
         Delivery Note   RANESSA KOSTA is a 23 y.o. G1P0 at [redacted]w[redacted]d Estimated Date of Delivery: 01/17/24  PRE-OPERATIVE DIAGNOSIS:  1) [redacted]w[redacted]d pregnancy.    POST-OPERATIVE DIAGNOSIS:  1) [redacted]w[redacted]d pregnancy s/p Vaginal, Spontaneous   Delivery Type: Vaginal, Spontaneous   Delivery Anesthesia: None  Labor Complications:  prolonged rupture of membranes    ESTIMATED BLOOD LOSS: 200 ml    FINDINGS:   1) female infant, Apgar scores of 8   at 1 minute and 9   at 5 minutes and a birthweight pending, infant remains skin to skin.    2) Nuchal cord: no  SPECIMENS:   PLACENTA:   Appearance: Intact, 3 vessel cord   Removal: Spontaneous     Disposition:  per protocol   DISPOSITION:  Infant to left in stable condition in the delivery room, with L&D personnel and mother,  NARRATIVE SUMMARY: Labor course:  Ms. KENNETTA PAVLOVIC is a G1P0 at [redacted]w[redacted]d who presented for PROM.  She progressed well in labor with pitocin .  She received no anesthesia and proceeded to complete dilation. She evidenced good maternal expulsive effort during the second stage. She went on to deliver a viable female infant. The placenta delivered without problems and was noted to be complete. A perineal and vaginal examination was performed. Episiotomy/Lacerations: 1st degree;Vaginal. Laceration was hemostatic , no repair needed. The patient tolerated this well.  Zelda Hummer, CNM  12/27/2023 12:31 AM

## 2023-06-22 ENCOUNTER — Telehealth: Payer: Self-pay

## 2023-06-22 NOTE — Telephone Encounter (Signed)
 Pt called triage, she is pregnant and has been feeling light headed and has passed out twice. Once yesterday and once this morning. Advised to go to ER to be seen and evaluated. She says she is able to drink and eat and is not feeling nauseous or vomiting. Is staying well hydrated. She states she will go to an urgent care first.

## 2023-06-27 ENCOUNTER — Telehealth (INDEPENDENT_AMBULATORY_CARE_PROVIDER_SITE_OTHER): Payer: Medicaid Other

## 2023-06-27 DIAGNOSIS — Z3401 Encounter for supervision of normal first pregnancy, first trimester: Secondary | ICD-10-CM | POA: Insufficient documentation

## 2023-06-27 DIAGNOSIS — Z3403 Encounter for supervision of normal first pregnancy, third trimester: Secondary | ICD-10-CM | POA: Insufficient documentation

## 2023-06-27 DIAGNOSIS — Z3A15 15 weeks gestation of pregnancy: Secondary | ICD-10-CM

## 2023-06-27 DIAGNOSIS — O0990 Supervision of high risk pregnancy, unspecified, unspecified trimester: Secondary | ICD-10-CM | POA: Insufficient documentation

## 2023-06-27 DIAGNOSIS — Z3689 Encounter for other specified antenatal screening: Secondary | ICD-10-CM

## 2023-06-27 NOTE — Patient Instructions (Signed)
Morning Sickness  Morning sickness is when you throw up or feel like you may throw up during pregnancy. This condition often occurs in the morning, but it can also occur at any time of day. Morning sickness is most common during the first three months of pregnancy, but it can go on throughout the pregnancy. Morning sickness is usually harmless. But if you throw up all the time, you should see your health care provider. You may also hear this condition called nausea and vomiting of pregnancy. What are the causes? The cause of morning sickness is not known. It may be linked to changes in hormones during pregnancy. What increases the risk? You're more likely to have morning sickness if: You had morning sickness in another pregnancy. You're pregnant with more than one baby, such as twins. You had morning sickness in other pregnancies. You have had motion sickness before you were pregnant. You have had bad headaches or migraines before you were pregnant. What are the signs or symptoms? Symptoms of morning sickness include: Feeling like you may throw up. Throwing up. How is this diagnosed? Morning sickness is diagnosed based on your symptoms. How is this treated? Treatment is usually not needed for morning sickness. You may only need to change what you eat. In some cases, your provider may give you: Vitamin B6 supplements. Medicines to prevent throwing up. Ginger. Follow these instructions at home: Medicines Take your medicines only as told by your provider. Do not use any prescription, over-the-counter, or herbal medicines for morning sickness without first talking with your provider. Take prenatal vitamins. These can stop or lessen the symptoms of morning sickness. If you feel like you may throw up after taking prenatal vitamins, take them at night or with a snack. Eating and drinking     Eat dry toast or crackers before getting out of bed. Eat 5 or 6 small meals a day. Try ginger  ale made with real ginger, ginger tea, or ginger candies. Drink fluids throughout the day. Eat protein foods when you need a snack. Nuts, yogurt, and cheese are good choices. Eat dry and bland foods like rice or baked potatoes. Foods that are high in carbohydrates are often helpful. Have someone cook for you if the smell of food makes you want to throw up. Foods to avoid Greasy foods. Fatty foods. Spicy foods. General instructions Try to avoid smells that make you feel sick. Use an air purifier to keep the air in your house free of smells. Try using an acupressure wristband. This is a wristband that's used to treat motion sickness. Try acupuncture. In this treatment, a provider puts thin needles into certain areas of your body to make you feel better. Brush your teeth after throwing up or rinse with a mix of baking soda and water. The acid in throw-up can hurt your teeth. Contact a health care provider if: Your symptoms do not get better. You feel dizzy or light-headed. You're losing weight. Get help right away if: The feeling that you may throw up will not go away, or you can't stop throwing up. You faint. You have very bad pain in your belly. This information is not intended to replace advice given to you by your health care provider. Make sure you discuss any questions you have with your health care provider. Document Revised: 02/24/2023 Document Reviewed: 09/02/2022 Elsevier Patient Education  2024 Elsevier Inc.Commonly Asked Questions During Pregnancy  Cats: A parasite can be excreted in cat feces.  To avoid exposure  you need to have another person empty the little box.  If you must empty the litter box you will need to wear gloves.  Wash your hands after handling your cat.  This parasite can also be found in raw or undercooked meat so this should also be avoided.  Colds, Sore Throats, Flu: Please check your medication sheet to see what you can take for symptoms.  If your symptoms  are unrelieved by these medications please call the office.  Dental Work: Most any dental work Agricultural consultant recommends is permitted.  X-rays should only be taken during the first trimester if absolutely necessary.  Your abdomen should be shielded with a lead apron during all x-rays.  Please notify your provider prior to receiving any x-rays.  Novocaine is fine; gas is not recommended.  If your dentist requires a note from [place] prior to dental work please call the office and we will provide one for you.  Exercise: Exercise is an important part of staying healthy during your pregnancy.  You may continue most exercises you were accustomed to prior to pregnancy.  Later in your pregnancy you will most likely notice you have difficulty with activities requiring balance like riding a bicycle.  It is important that you listen to your body and avoid activities that put you at a higher risk of falling.  Adequate rest and staying well hydrated are a must!  If you have questions about the safety of specific activities ask your provider.    Exposure to Children with illness: Try to avoid obvious exposure; report any symptoms to [place] when noted,  If you have chicken pos, red measles or mumps, you should be immune to these diseases.   Please do not take any vaccines while pregnant unless you have checked with your OB provider.  Fetal Movement: After 28 weeks we recommend you do "kick counts" twice daily.  Lie or sit down in a calm quiet environment and count your baby movements "kicks".  You should feel your baby at least 10 times per hour.  If you have not felt 10 kicks within the first hour get up, walk around and have something sweet to eat or drink then repeat for an additional hour.  If count remains less than 10 per hour notify your provider.  Fumigating: Follow your pest control agent's advice as to how long to stay out of your home.  Ventilate the area well before re-entering.  Hemorrhoids:   Most over-the-counter  preparations can be used during pregnancy.  Check your medication to see what is safe to use.  It is important to use a stool softener or fiber in your diet and to drink lots of liquids.  If hemorrhoids seem to be getting worse please call the office.   Hot Tubs:  Hot tubs Jacuzzis and saunas are not recommended while pregnant.  These increase your internal body temperature and should be avoided.  Intercourse:  Sexual intercourse is safe during pregnancy as long as you are comfortable, unless otherwise advised by your provider.  Spotting may occur after intercourse; report any bright red bleeding that is heavier than spotting.  Labor:  If you know that you are in labor, please go to the hospital.  If you are unsure, please call the office and let us help you decide what to do.  Lifting, straining, etc:  If your job requires heavy lifting or straining please check with your provider for any limitations.  Generally, you should not lift items  heavier than that you can lift simply with your hands and arms (no back muscles)  Painting:  Paint fumes do not harm your pregnancy, but may make you ill and should be avoided if possible.  Latex or water based paints have less odor than oils.  Use adequate ventilation while painting.  Permanents & Hair Color:  Chemicals in hair dyes are not recommended as they cause increase hair dryness which can increase hair loss during pregnancy.  " Highlighting" and permanents are allowed.  Dye may be absorbed differently and permanents may not hold as well during pregnancy.  Sunbathing:  Use a sunscreen, as skin burns easily during pregnancy.  Drink plenty of fluids; avoid over heating.  Tanning Beds:  Because their possible side effects are still unknown, tanning beds are not recommended.  Ultrasound Scans:  Routine ultrasounds are performed at approximately 20 weeks.  You will be able to see your baby's general anatomy an if you would like to know the gender this can  usually be determined as well.  If it is questionable when you conceived you may also receive an ultrasound early in your pregnancy for dating purposes.  Otherwise ultrasound exams are not routinely performed unless there is a medical necessity.  Although you can request a scan we ask that you pay for it when conducted because insurance does not cover " patient request" scans.  Work: If your pregnancy proceeds without complications you may work until your due date, unless your physician or employer advises otherwise.  Round Ligament Pain/Pelvic Discomfort:  Sharp, shooting pains not associated with bleeding are fairly common, usually occurring in the second trimester of pregnancy.  They tend to be worse when standing up or when you remain standing for long periods of time.  These are the result of pressure of certain pelvic ligaments called "round ligaments".  Rest, Tylenol and heat seem to be the most effective relief.  As the womb and fetus grow, they rise out of the pelvis and the discomfort improves.  Please notify the office if your pain seems different than that described.  It may represent a more serious condition.  First Trimester of Pregnancy  The first trimester of pregnancy starts on the first day of your last monthly period until the end of week 13. This is months 1 through 3 of pregnancy. A week after a sperm fertilizes an egg, the egg will implant into the wall of the uterus and begin to develop into a baby. Body changes during your first trimester Your body goes through many changes during pregnancy. The changes usually return to normal after your baby is born. Physical changes Your breasts may grow larger and may hurt. The area around your nipples may get darker. Your periods will stop. Your hair and nails may grow faster. You may pee more often. Health changes You may tire easily. Your gums may bleed and may be sensitive when you brush and floss. You may not feel hungry. You may  have heartburn. You may throw up or feel like you may throw up. You may want to eat some foods, but not others. You may have headaches. You may have trouble pooping (constipation). Other changes Your emotions may change from day to day. You may have more dreams. Follow these instructions at home: Medicines Talk to your health care provider if you're taking medicines. Ask if the medicines are safe to take during pregnancy. Your provider may change the medicines that you take. Do not take any  medicines unless told to by your provider. Take a prenatal vitamin that has at least 600 micrograms (mcg) of folic acid. Do not use herbal medicines, illegal substances, or medicines that are not approved by your provider. Eating and drinking While you're pregnant your body needs extra food for your growing baby. Talk with your provider about what to eat while pregnant. Activity Most women are able to exercise during pregnancy. Exercises may need to change as your pregnancy goes on. Talk to your provider about your activities and exercise routines. Relieving pain and discomfort Wear a good, supportive bra if your breasts hurt. Rest with your legs raised if you have leg cramps or low back pain. Safety Wear your seatbelt at all times when you're in a car. Talk to your provider if someone hits you, hurts you, or yells at you. Talk with your provider if you're feeling sad or have thoughts of hurting yourself. Lifestyle Certain things can be harmful while you're pregnant. Follow these rules: Do not use hot tubs, steam rooms, or saunas. Do not douche. Do not use tampons or scented pads. Do not drink alcohol,smoke, vape, or use products with nicotine or tobacco in them. If you need help quitting, talk with your provider. Avoid cat litter boxes and soil used by cats. These things carry germs that can cause harm to your pregnancy and your baby. General instructions Keep all follow-up visits. It helps you  and your unborn baby stay as healthy as possible. Write down your questions. Take them to your visits. Your provider will: Talk with you about your overall health. Give you advice or refer you to specialists who can help with different needs, including: Prenatal education classes. Mental health and counseling. Foods and healthy eating. Ask for help if you need help with food. Call your dentist and ask to be seen. Brush your teeth with a soft toothbrush. Floss gently. Where to find more information American Pregnancy Association: americanpregnancy.org [employer]: acog.org Office on [employer] Health: [employer] Contact a health care provider if: You feel dizzy, faint, or have a fever. You vomit or have watery poop (diarrhea) for 2 days or more. You have abnormal discharge or bleeding from your vagina. You have pain when you pee or your pee smells bad. You have cramps, pain, or pressure in your belly area. Get help right away if: You have trouble breathing or chest pain. You have any kind of injury, such as from a fall or a car crash. These symptoms may be an emergency. Get help right away. Call 911. Do not wait to see if the symptoms will go away. Do not drive yourself to the hospital. This information is not intended to replace advice given to you by your health care provider. Make sure you discuss any questions you have with your health care provider. Document Revised: 02/24/2023 Document Reviewed: 09/24/2022 Elsevier Patient Education  2024 Elsevier Inc.   Common Medications Safe in Pregnancy  Acne:      Constipation:  Benzoyl Peroxide     Colace  Clindamycin      Dulcolax Suppository  Topica Erythromycin     Fibercon  Salicylic Acid      Metamucil         Miralax AVOID:        Senakot   Accutane    Cough:  Retin-A       Cough Drops  Tetracycline      Phenergan w/ Codeine if Rx  Minocycline  Robitussin (Plain &  DM)  Antibiotics:     Crabs/Lice:  Ceclor       RID  Cephalosporins    AVOID:  E-Mycins      Kwell  Keflex  Macrobid/Macrodantin   Diarrhea:  Penicillin      Kao-Pectate  Zithromax      Imodium AD         PUSH FLUIDS AVOID:       Cipro     Fever:  Tetracycline      Tylenol (Regular or Extra  Minocycline       Strength)  Levaquin      Extra Strength-Do not          Exceed 8 tabs/24 hrs Caffeine:        200mg /day (equiv. To 1 cup of coffee or  approx. 3 12 oz sodas)         Gas: Cold/Hayfever:       Gas-X  Benadryl      Mylicon  Claritin       Phazyme  **Claritin-D        Chlor-Trimeton    Headaches:  Dimetapp      ASA-Free Excedrin  Drixoral-Non-Drowsy     Cold Compress  Mucinex (Guaifenasin)     Tylenol (Regular or Extra  Sudafed/Sudafed-12 Hour     Strength)  **Sudafed PE Pseudoephedrine   Tylenol Cold & Sinus     Vicks Vapor Rub  Zyrtec  **AVOID if Problems With Blood Pressure         Heartburn: Avoid lying down for at least 1 hour after meals  Aciphex      Maalox     Rash:  Milk of Magnesia     Benadryl    Mylanta       1% Hydrocortisone Cream  Pepcid  Pepcid Complete   Sleep Aids:  Prevacid      Ambien   Prilosec       Benadryl  Rolaids       Chamomile Tea  Tums (Limit 4/day)     Unisom         Tylenol PM         Warm milk-add vanilla or  Hemorrhoids:       Sugar for taste  Anusol/Anusol H.C.  (RX: Analapram 2.5%)  Sugar Substitutes:  Hydrocortisone OTC     Ok in moderation  Preparation H      Tucks        Vaseline lotion applied to tissue with wiping    Herpes:     Throat:  Acyclovir      Oragel  Famvir  Valtrex     Vaccines:         Flu Shot Leg Cramps:       *Gardasil  Benadryl      Hepatitis A         Hepatitis B Nasal Spray:       Pneumovax  Saline Nasal Spray     Polio Booster         Tetanus Nausea:       Tuberculosis test or PPD  Vitamin B6 25 mg TID   AVOID:    Dramamine      *Gardasil  Emetrol       Live Poliovirus  Ginger  Root 250 mg QID    MMR (measles, mumps &  High Complex Carbs @ Bedtime    rebella)  Sea Bands-Accupressure    Varicella (Chickenpox)  Unisom 1/2  tab TID     *No known complications           If received before Pain:         Known pregnancy;   Darvocet       Resume series after  Lortab        Delivery  Percocet    Yeast:   Tramadol      Femstat  Tylenol 3      Gyne-lotrimin  Ultram       Monistat  Vicodin           MISC:         All Sunscreens           Hair Coloring/highlights          Insect Repellant's          (Including DEET)         Mystic Tans

## 2023-06-27 NOTE — Progress Notes (Signed)
New OB Intake  I connected with  Kim Hawkins on 06/27/23 at  1:15 PM EST by MyChart Video Visit and verified that I am speaking with the correct person using two identifiers. Nurse is located at [facility] and pt is located at home.  I discussed the limitations, risks, security and privacy concerns of performing an evaluation and management service by telephone and the availability of in person appointments. I also discussed with the patient that there may be a patient responsible charge related to this service. The patient expressed understanding and agreed to proceed.  I explained I am completing New OB Intake today. We discussed her EDD of 07/13/21025 that is based on LMP of 03/13/2023. Pt is [id number]. I reviewed her allergies, medications, Medical/Surgical/OB history, and appropriate screenings. There are no cats in the home in the home. Based on history, this is a/an pregnancy uncomplicated . She has no obstetrical history.  There are no active problems to display for this patient.   Concerns addressed today  Delivery Plans:  Plans to deliver at [facility]  Anatomy US Explained second scheduled [place] will be around 19 weeks.   Labs Discussed Avelina Laine genetic screening with patient. Patient desires genetic testing to be drawn at new OB physical visit, but does not want to know the gender of baby. Discussed possible labs to be drawn at new OB appointment.  COVID Vaccine Patient has not had COVID vaccine.  Patient declines vaccine. Patient declines TDAP. Patient desires RSV vaccine.  Social Determinants of Health Food Insecurity: denies food insecurity WIC Referral: Patient is currently receiving WIC Transportation: Patient denies transportation needs. Childcare: Discussed no children allowed at ultrasound appointments.   First visit review I reviewed new OB appt with pt. I explained she will have ob bloodwork and pap smear/pelvic exam if indicated. Explained pt  will be seen by Hartley Barefoot, CNM at first visit; encounter routed to appropriate provider.     Santiago Bumpers, CMA [facility] 06/27/2023  1:11 PM

## 2023-06-30 ENCOUNTER — Ambulatory Visit
Admission: RE | Admit: 2023-06-30 | Discharge: 2023-06-30 | Disposition: A | Discharge status: Home / Self Care | Payer: Medicaid Other | Source: Ambulatory Visit | Attending: Certified Nurse Midwife | Admitting: Certified Nurse Midwife

## 2023-06-30 DIAGNOSIS — Z3A11 11 weeks gestation of pregnancy: Secondary | ICD-10-CM | POA: Diagnosis not present

## 2023-06-30 DIAGNOSIS — Z3A15 15 weeks gestation of pregnancy: Secondary | ICD-10-CM

## 2023-06-30 DIAGNOSIS — Z3401 Encounter for supervision of normal first pregnancy, first trimester: Secondary | ICD-10-CM

## 2023-07-01 ENCOUNTER — Encounter: Payer: Self-pay | Admitting: Certified Nurse Midwife

## 2023-07-08 ENCOUNTER — Encounter: Payer: Medicaid Other | Admitting: Certified Nurse Midwife

## 2023-07-08 NOTE — Telephone Encounter (Signed)
Contacted the patient via phone, She is rescheduled for 2/6 at 3:15 pm with Dr Valentino Saxon.

## 2023-07-14 ENCOUNTER — Encounter: Payer: Medicaid Other | Admitting: Obstetrics and Gynecology

## 2023-07-28 ENCOUNTER — Encounter: Payer: Self-pay | Admitting: Obstetrics

## 2023-07-28 ENCOUNTER — Other Ambulatory Visit (HOSPITAL_COMMUNITY)
Admission: RE | Admit: 2023-07-28 | Discharge: 2023-07-28 | Disposition: A | Discharge status: Home / Self Care | Payer: Medicaid Other | Source: Ambulatory Visit | Attending: Obstetrics | Admitting: Obstetrics

## 2023-07-28 ENCOUNTER — Ambulatory Visit (INDEPENDENT_AMBULATORY_CARE_PROVIDER_SITE_OTHER): Payer: Medicaid Other | Admitting: Obstetrics

## 2023-07-28 ENCOUNTER — Encounter: Payer: Medicaid Other | Admitting: Obstetrics

## 2023-07-28 VITALS — BP 118/72 | HR 109 | Wt 103.0 lb

## 2023-07-28 DIAGNOSIS — Z13220 Encounter for screening for lipoid disorders: Secondary | ICD-10-CM

## 2023-07-28 DIAGNOSIS — Z3482 Encounter for supervision of other normal pregnancy, second trimester: Secondary | ICD-10-CM | POA: Diagnosis not present

## 2023-07-28 DIAGNOSIS — Z113 Encounter for screening for infections with a predominantly sexual mode of transmission: Secondary | ICD-10-CM

## 2023-07-28 DIAGNOSIS — Z3689 Encounter for other specified antenatal screening: Secondary | ICD-10-CM

## 2023-07-28 DIAGNOSIS — Z3480 Encounter for supervision of other normal pregnancy, unspecified trimester: Secondary | ICD-10-CM

## 2023-07-28 DIAGNOSIS — Z1379 Encounter for other screening for genetic and chromosomal anomalies: Secondary | ICD-10-CM

## 2023-07-28 DIAGNOSIS — Z3A15 15 weeks gestation of pregnancy: Secondary | ICD-10-CM | POA: Diagnosis not present

## 2023-07-28 DIAGNOSIS — Z0184 Encounter for antibody response examination: Secondary | ICD-10-CM

## 2023-07-28 DIAGNOSIS — Z131 Encounter for screening for diabetes mellitus: Secondary | ICD-10-CM

## 2023-07-28 DIAGNOSIS — D649 Anemia, unspecified: Secondary | ICD-10-CM

## 2023-07-28 IMAGING — CR DG CHEST 2V
1 series · 2 of 2 positions shown · non-contrast
Comparison: July 17, 2019.

CLINICAL DATA: Cough, fever.

EXAM:
CHEST - 2 VIEW

[Series 1: w chest pa · 0.14mm/px · 2 of 2 slices shown]
[im 1/2]
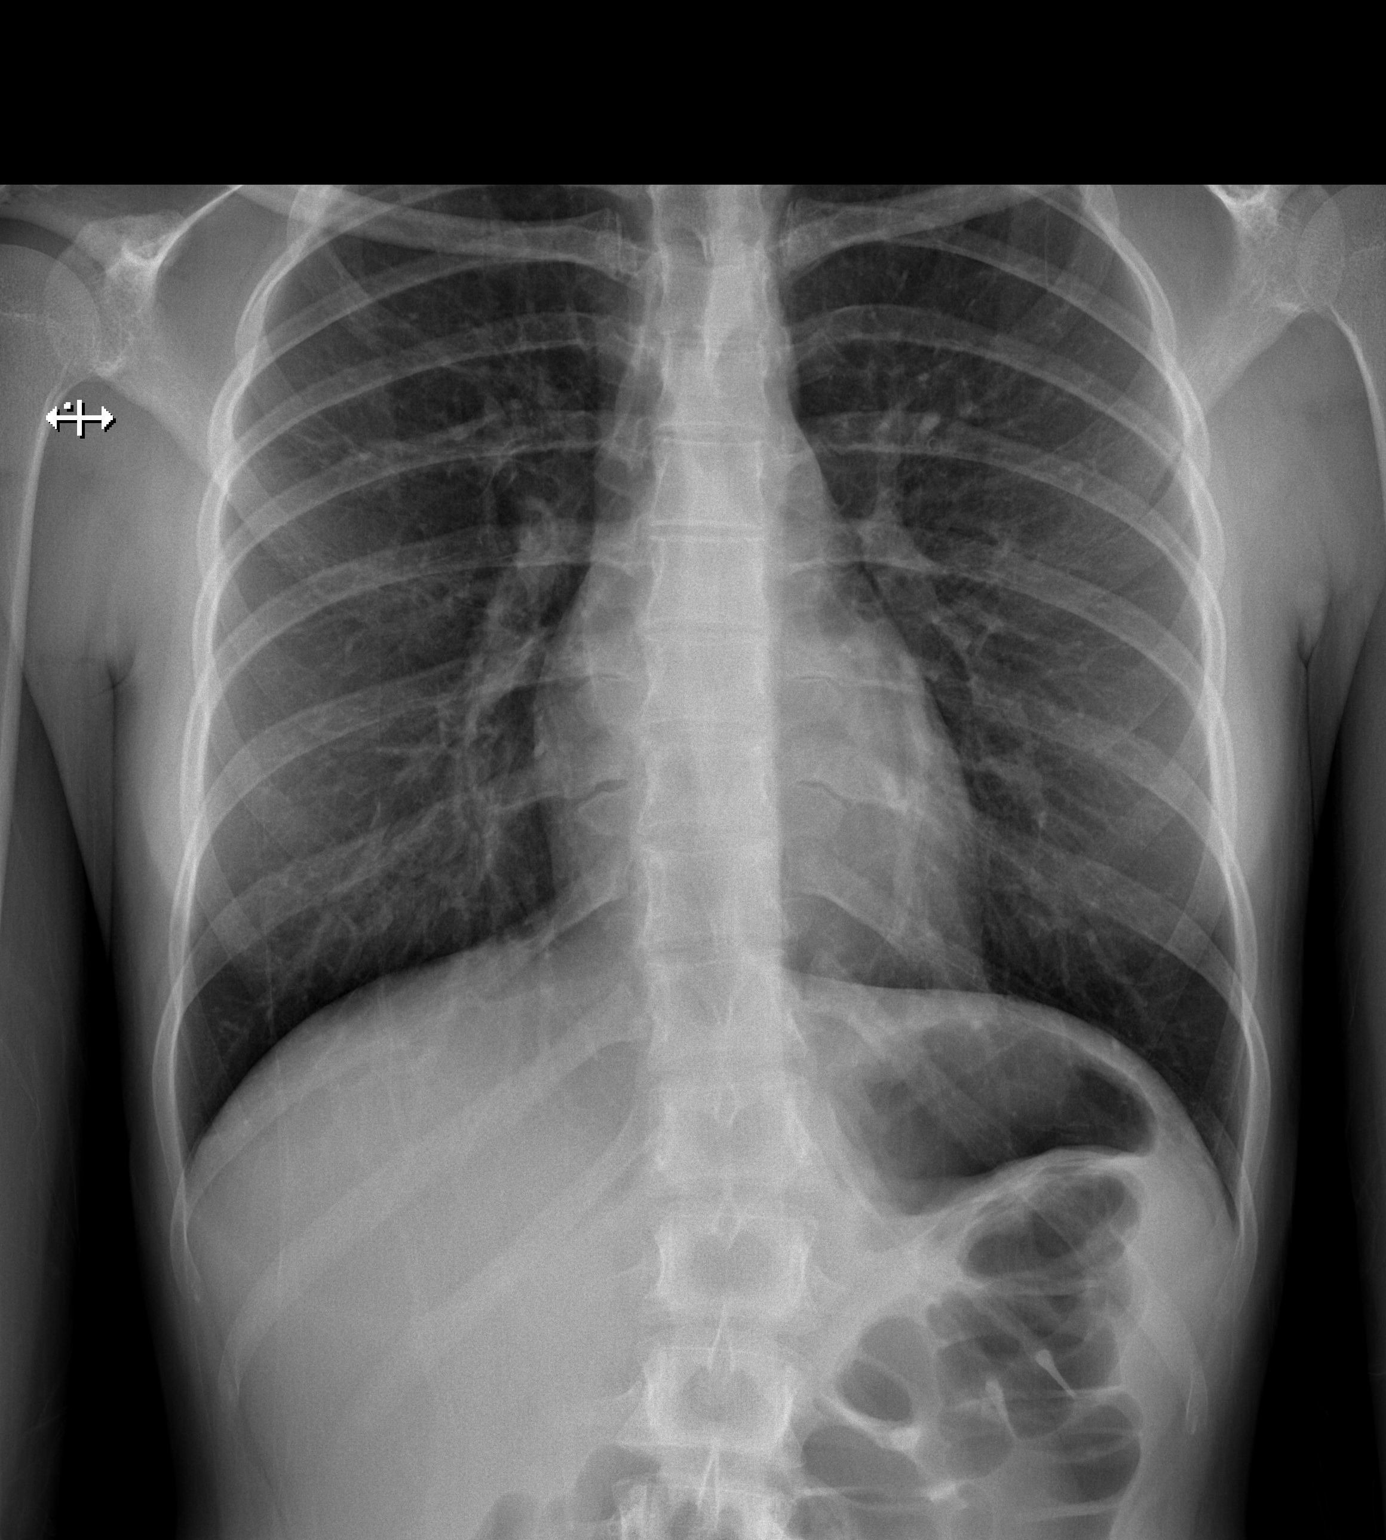
[im 2/2]
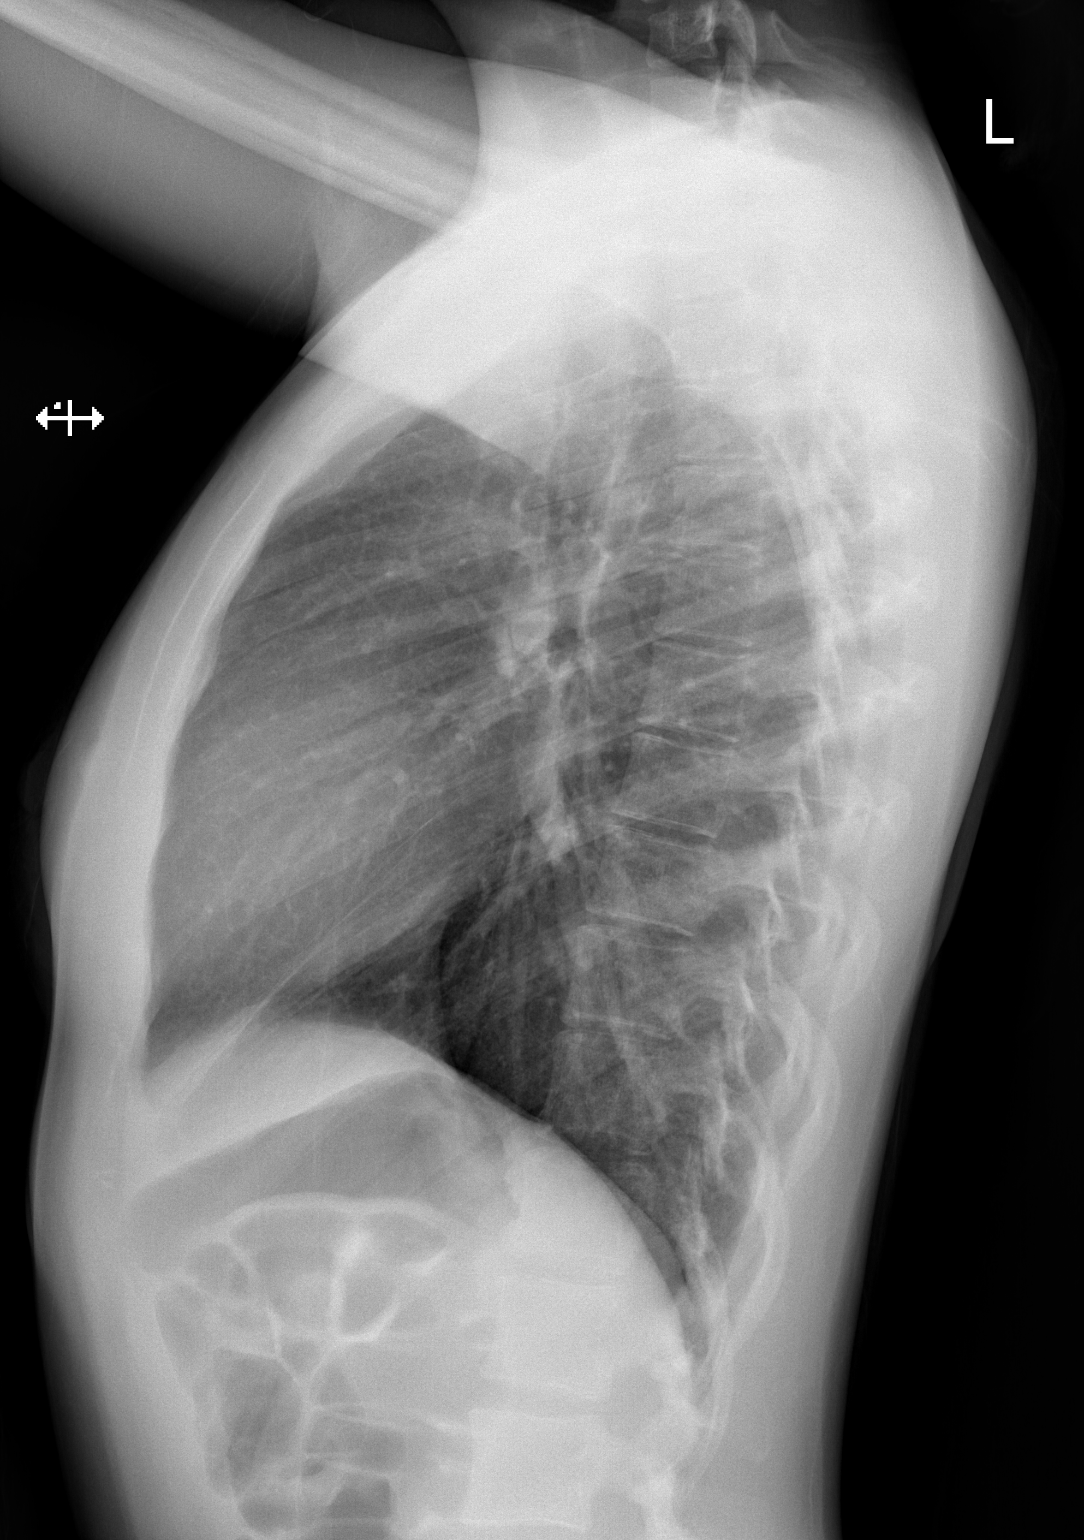

[2 of 2 positions shown; findings below may reference images not displayed]

FINDINGS: The heart size and mediastinal contours are within normal limits.
Both lungs are clear. The visualized skeletal structures are
unremarkable.
IMPRESSION: No active cardiopulmonary disease.

## 2023-07-28 NOTE — Progress Notes (Signed)
 NEW OB HISTORY AND PHYSICAL  SUBJECTIVE:       Kim Hawkins is a 23 y.o. G1P0 female, Patient's last menstrual period was 03/13/2023 (exact date)., Estimated Date of Delivery: 01/17/24, [redacted]w[redacted]d, presents today for establishment of Prenatal Care. She reports nausea that has resolved and occasional headaches   Social history Partner/Relationship: Ronaldo Miyamoto, boyfriend Living situation: lives with Kyle's family Work: car detailing Exercise: at work Substance use: denies   Gynecologic History Patient's last menstrual period was 03/13/2023 (exact date). Normal Contraception: none Last Pap: Not yet d/t age.   Obstetric History OB History  Gravida Para Term Preterm AB Living  1       SAB IAB Ectopic Multiple Live Births          # Outcome Date GA Lbr Len/2nd Weight Sex Type Anes PTL Lv  1 Current             No past medical history on file.  Past Surgical History:  Procedure Laterality Date   NO PAST SURGERIES      Current Outpatient Medications on File Prior to Visit  Medication Sig Dispense Refill   albuterol (VENTOLIN HFA) 108 (90 Base) MCG/ACT inhaler Inhale into the lungs.     Prenatal Vit-Fe Fumarate-FA (MULTIVITAMIN-PRENATAL) 27-0.8 MG TABS tablet Take 1 tablet by mouth daily at 12 noon.     No current facility-administered medications on file prior to visit.    Allergies  Allergen Reactions   Penicillins Hives   Cocos Nucifera Hives   Latex     Social History   Socioeconomic History   Marital status: Single    Spouse name: Not on file   Number of children: 0   Years of education: 10   Highest education level: 9th grade  Occupational History   Not on file  Tobacco Use   Smoking status: Never    Passive exposure: Never   Smokeless tobacco: Never  Vaping Use   Vaping status: Former  Substance and Sexual Activity   Alcohol use: No   Drug use: No   Sexual activity: Yes    Partners: Male    Birth control/protection: Other-see comments    Comment: Pregnant   Other Topics Concern   Not on file  Social History Narrative   Not on file   Social Drivers of Health   Financial Resource Strain: Not on file  Food Insecurity: No Food Insecurity (06/27/2023)   Hunger Vital Sign    Worried About Running Out of Food in the Last Year: Never true    Ran Out of Food in the Last Year: Never true  Transportation Needs: No Transportation Needs (06/27/2023)   PRAPARE - Administrator, Civil Service (Medical): No    Lack of Transportation (Non-Medical): No  Physical Activity: Not on file  Stress: No Stress Concern Present (06/27/2023)   [employer] of Occupational Health - Occupational Stress Questionnaire    Feeling of Stress : Not at all  Social Connections: Unknown (05/04/2022)   Received from [facility]   Social Network    Social Network: Not on file  Intimate Partner Violence: Not At Risk (06/27/2023)   Humiliation, Afraid, Rape, and Kick questionnaire    Fear of Current or Ex-Partner: No    Emotionally Abused: No    Physically Abused: No    Sexually Abused: No    Family History  Problem Relation Age of Onset   Ulcers Mother    Heart failure Sister  Breast cancer Neg Hx    Cervical cancer Neg Hx    Colon cancer Neg Hx     The following portions of the patient's history were reviewed and updated as appropriate: allergies, current medications, past OB history, past medical history, past surgical history, past family history, past social history, and problem list.  Constitutional: Denied constitutional symptoms, night sweats, recent illness, fatigue, fever, insomnia and weight loss.  Eyes: Denied eye symptoms, eye pain, photophobia, vision change and visual disturbance.  Ears/Nose/Throat/Neck: Denied ear, nose, throat or neck symptoms, hearing loss, nasal discharge, sinus congestion and sore throat.  Cardiovascular: Denied cardiovascular symptoms, arrhythmia, chest pain/pressure, edema, exercise intolerance, orthopnea and  palpitations.  Respiratory: Denied pulmonary symptoms, asthma, pleuritic pain, productive sputum, cough, dyspnea and wheezing.  Gastrointestinal: Denied, gastro-esophageal reflux, melena, nausea and vomiting.  Genitourinary: Denied genitourinary symptoms including symptomatic vaginal discharge, pelvic relaxation issues, and urinary complaints.  Musculoskeletal: Denied musculoskeletal symptoms, stiffness, swelling, muscle weakness and myalgia.  Dermatologic: Denied dermatology symptoms, rash and scar.  Neurologic: Denied neurology symptoms, dizziness, headache, neck pain and syncope.  Psychiatric: Denied psychiatric symptoms, anxiety and depression.  Endocrine: Denied endocrine symptoms except hot flashes.    Indications for ASA therapy (per uptodate) One of the following: Previous pregnancy with preeclampsia, especially early onset and with an adverse outcome No Multifetal gestation No Chronic hypertension No Type 1 or 2 diabetes mellitus No Chronic kidney disease No Autoimmune disease (antiphospholipid syndrome, systemic lupus erythematosus) No  Two or more of the following: Nulliparity Yes Obesity (body mass index >30 kg/m2) No Family history of preeclampsia in mother or sister No Age >=35 years No Sociodemographic characteristics (African American race, low socioeconomic level) No Personal risk factors (eg, previous pregnancy with low birth weight or small for gestational age infant, previous adverse pregnancy outcome [eg, stillbirth], interval >10 years between pregnancies) No   OBJECTIVE: Initial Physical Exam (New OB)  GENERAL APPEARANCE: alert, well appearing HEAD: normocephalic, atraumatic MOUTH: mucous membranes moist, pharynx normal without lesions and poor dentition THYROID: no thyromegaly or masses present BREASTS: no masses noted, no significant tenderness, no palpable axillary nodes, no skin changes LUNGS: clear to auscultation, no wheezes, rales or rhonchi,  symmetric air entry HEART:  ABDOMEN: regular rate and rhythm, murmursoft, nontender, nondistended, no abnormal masses, no epigastric pain EXTREMITIES: no redness or tenderness in the calves or thighs SKIN: normal coloration and turgor, no rashes LYMPH NODES: no adenopathy palpable NEUROLOGIC: alert, oriented, normal speech, no focal findings or movement disorder noted  PELVIC EXAM declined  ASSESSMENT: Normal pregnancy [redacted]w[redacted]d    PLAN: Routine prenatal care. We discussed an overview of prenatal care and when to call. Reviewed diet, exercise, and weight gain recommendations in pregnancy. Discussed benefits of breastfeeding and lactation resources at [facility] - does not plan to breastfeed. I answered all questions. Labs and genetic screening next week - pt unable to wait for phlebotomist to return today.  See orders  Guadlupe Spanish, CNM

## 2023-07-29 LAB — URINALYSIS, ROUTINE W REFLEX MICROSCOPIC
Bilirubin, UA: NEGATIVE
Glucose, UA: NEGATIVE
Leukocytes,UA: NEGATIVE
Nitrite, UA: NEGATIVE
Protein,UA: NEGATIVE
RBC, UA: NEGATIVE
Specific Gravity, UA: 1.029 (ref 1.005–1.030)
Urobilinogen, Ur: 0.2 mg/dL (ref 0.2–1.0)
pH, UA: 5.5 (ref 5.0–7.5)

## 2023-07-30 LAB — URINE CULTURE, OB REFLEX

## 2023-07-30 LAB — CULTURE, OB URINE

## 2023-08-01 LAB — CERVICOVAGINAL ANCILLARY ONLY
Bacterial Vaginitis (gardnerella): NEGATIVE
Candida Glabrata: NEGATIVE
Candida Vaginitis: NEGATIVE
Chlamydia: NEGATIVE
Comment: NEGATIVE
Comment: NEGATIVE
Comment: NEGATIVE
Comment: NEGATIVE
Comment: NEGATIVE
Comment: NORMAL
Neisseria Gonorrhea: NEGATIVE
Trichomonas: NEGATIVE

## 2023-08-03 ENCOUNTER — Other Ambulatory Visit: Payer: Medicaid Other

## 2023-08-10 ENCOUNTER — Other Ambulatory Visit: Payer: Medicaid Other

## 2023-08-10 LAB — OB RESULTS CONSOLE VARICELLA ZOSTER ANTIBODY, IGG: Varicella: NON-IMMUNE/NOT IMMUNE

## 2023-08-11 LAB — CBC/D/PLT+RPR+RH+ABO+RUBIGG...
Antibody Screen: NEGATIVE
Basophils Absolute: 0 10*3/uL (ref 0.0–0.2)
Basos: 1 %
EOS (ABSOLUTE): 0.1 10*3/uL (ref 0.0–0.4)
Eos: 1 %
HCV Ab: NONREACTIVE
HIV Screen 4th Generation wRfx: NONREACTIVE
Hematocrit: 35 % (ref 34.0–46.6)
Hemoglobin: 11.8 g/dL (ref 11.1–15.9)
Hepatitis B Surface Ag: NEGATIVE
Immature Grans (Abs): 0 10*3/uL (ref 0.0–0.1)
Immature Granulocytes: 1 %
Lymphocytes Absolute: 1.3 10*3/uL (ref 0.7–3.1)
Lymphs: 21 %
MCH: 32.4 pg (ref 26.6–33.0)
MCHC: 33.7 g/dL (ref 31.5–35.7)
MCV: 96 fL (ref 79–97)
Monocytes Absolute: 0.5 10*3/uL (ref 0.1–0.9)
Monocytes: 7 %
Neutrophils Absolute: 4.4 10*3/uL (ref 1.4–7.0)
Neutrophils: 69 %
Platelets: 268 10*3/uL (ref 150–450)
RBC: 3.64 x10E6/uL — ABNORMAL LOW (ref 3.77–5.28)
RDW: 12.9 % (ref 11.7–15.4)
RPR Ser Ql: NONREACTIVE
Rh Factor: POSITIVE
Rubella Antibodies, IGG: 4.2 {index} (ref >0.99)
Varicella zoster IgG: NONREACTIVE
WBC: 6.4 10*3/uL (ref 3.4–10.8)

## 2023-08-11 LAB — HCV INTERPRETATION

## 2023-08-14 ENCOUNTER — Encounter: Payer: Self-pay | Admitting: Obstetrics

## 2023-08-14 LAB — MATERNIT 21 PLUS CORE, BLOOD
Fetal Fraction: 23
Result (T21): NEGATIVE
Trisomy 13 (Patau syndrome): NEGATIVE
Trisomy 18 (Edwards syndrome): NEGATIVE
Trisomy 21 (Down syndrome): NEGATIVE

## 2023-09-01 ENCOUNTER — Ambulatory Visit (INDEPENDENT_AMBULATORY_CARE_PROVIDER_SITE_OTHER): Payer: Medicaid Other | Admitting: Licensed Practical Nurse

## 2023-09-01 ENCOUNTER — Ambulatory Visit: Payer: Medicaid Other

## 2023-09-01 ENCOUNTER — Encounter: Payer: Self-pay | Admitting: Licensed Practical Nurse

## 2023-09-01 VITALS — BP 121/65 | HR 73 | Wt 105.5 lb

## 2023-09-01 DIAGNOSIS — Z3A19 19 weeks gestation of pregnancy: Secondary | ICD-10-CM | POA: Diagnosis not present

## 2023-09-01 DIAGNOSIS — Z3401 Encounter for supervision of normal first pregnancy, first trimester: Secondary | ICD-10-CM

## 2023-09-01 DIAGNOSIS — Z3689 Encounter for other specified antenatal screening: Secondary | ICD-10-CM | POA: Diagnosis not present

## 2023-09-01 DIAGNOSIS — Z3A20 20 weeks gestation of pregnancy: Secondary | ICD-10-CM | POA: Diagnosis not present

## 2023-09-01 NOTE — Assessment & Plan Note (Addendum)
-  TWG 7lbs, pt eating adequately, has never been able to gain a lot of weight -Anatomy US today, normal findings -reviewed Varicella non immune  -encouraged CBE

## 2023-09-01 NOTE — Progress Notes (Signed)
    Return Prenatal Note   Subjective   23 y.o. G1P0 at [redacted]w[redacted]d presents for this follow-up prenatal visit.  Patient Here with Kim Hawkins, doing well admits to being tired but otherwise fine. Now working with Kim Hawkins for is [employer].  Patient reports: Movement: Present Contractions: Not present  Objective   Flow sheet Vitals: Pulse Rate: 73 BP: 121/65 Fetal Heart Rate (bpm): 160 Total weight gain: 7 lb 8 oz (3.402 kg)  General Appearance  No acute distress, well appearing, and well nourished Pulmonary   Normal work of breathing Neurologic   Alert and oriented to person, place, and time Psychiatric   Mood and affect within normal limits  Assessment/Plan   Plan  23 y.o. G1P0 at [redacted]w[redacted]d presents for follow-up OB visit. Reviewed prenatal record including previous visit note.  Encounter for supervision of normal first pregnancy in first trimester -TWG 7lbs, pt eating adequately, has never been able to gain a lot of weight -Anatomy US today, normal findings -reviewed Varicella non immune      No orders of the defined types were placed in this encounter.  Return in about 4 weeks (around 09/29/2023) for ROB.   Future Appointments  Date Time Provider Department Center  09/26/2023 10:15 AM Akanksha Bellmore, Courtney Heys, CNM [facility] None    For next visit:  continue with routine prenatal care     Ellouise Newer [facility], CNM  08/31/2508:12 AM

## 2023-09-05 ENCOUNTER — Other Ambulatory Visit: Payer: Self-pay | Admitting: Obstetrics

## 2023-09-05 ENCOUNTER — Encounter: Payer: Self-pay | Admitting: Obstetrics

## 2023-09-05 DIAGNOSIS — Z3689 Encounter for other specified antenatal screening: Secondary | ICD-10-CM

## 2023-09-05 NOTE — Progress Notes (Signed)
 US shows EFW in 16th %ile. Growth [place] ordered for 26-28 w.  Glenetta Borg, CNM

## 2023-09-13 ENCOUNTER — Telehealth: Payer: Self-pay

## 2023-09-14 NOTE — Telephone Encounter (Signed)
 Contacted the patient via phone,No answer, "Call couldn't be completed as dialed". Opening on 4/11 with Carie Caddy.

## 2023-09-15 ENCOUNTER — Ambulatory Visit (INDEPENDENT_AMBULATORY_CARE_PROVIDER_SITE_OTHER): Admitting: Obstetrics

## 2023-09-15 ENCOUNTER — Other Ambulatory Visit (HOSPITAL_COMMUNITY)
Admission: RE | Admit: 2023-09-15 | Discharge: 2023-09-15 | Disposition: A | Discharge status: Home / Self Care | Source: Ambulatory Visit | Attending: Obstetrics | Admitting: Obstetrics

## 2023-09-15 ENCOUNTER — Ambulatory Visit
Admission: RE | Admit: 2023-09-15 | Discharge: 2023-09-15 | Disposition: A | Discharge status: Home / Self Care | Source: Ambulatory Visit | Attending: Obstetrics | Admitting: Obstetrics

## 2023-09-15 VITALS — BP 138/72 | HR 106 | Wt 108.0 lb

## 2023-09-15 DIAGNOSIS — Z3A22 22 weeks gestation of pregnancy: Secondary | ICD-10-CM

## 2023-09-15 DIAGNOSIS — Z3401 Encounter for supervision of normal first pregnancy, first trimester: Secondary | ICD-10-CM

## 2023-09-15 DIAGNOSIS — R109 Unspecified abdominal pain: Secondary | ICD-10-CM | POA: Diagnosis not present

## 2023-09-15 DIAGNOSIS — Z3402 Encounter for supervision of normal first pregnancy, second trimester: Secondary | ICD-10-CM

## 2023-09-15 DIAGNOSIS — O26892 Other specified pregnancy related conditions, second trimester: Secondary | ICD-10-CM | POA: Diagnosis not present

## 2023-09-15 DIAGNOSIS — O26899 Other specified pregnancy related conditions, unspecified trimester: Secondary | ICD-10-CM | POA: Insufficient documentation

## 2023-09-15 NOTE — Progress Notes (Addendum)
    Return Prenatal Note   Assessment/Plan   Plan  23 y.o. G1P0 at [redacted]w[redacted]d presents for follow-up OB visit. Reviewed prenatal record including previous visit note.  Abdominal pain affecting pregnancy - Consulted Dr. Luster Salters who also examined the patient and recommended Stat ABD US .  - Collected and sent swabs and urine.  - Recommended CBC, CMP but declined today d/t needle phobia.  - If pain gets worse, or pt develops fever to present to emergency department.  - May try heating pad, warm shower/bath, tylenol, good nutrition and hydration.   Encounter for supervision of normal first pregnancy in first trimester - Anticipatory guidance reviewed.  - Reviewed kick counts and preterm labor warning signs. Instructed to call office or come to hospital with persistent headache, vision changes, regular contractions, leaking of fluid, decreased fetal movement or vaginal bleeding.      Orders Placed This Encounter  Procedures   US  ABDOMEN LIMITED RUQ (LIVER/GB)    Call report # is [phone] do not hold    Standing Status:   Future    Number of Occurrences:   1    Expected Date:   09/15/2023    Expiration Date:   09/14/2024    Reason for Exam (SYMPTOM  OR DIAGNOSIS REQUIRED):   Colicky RUQ pain, evaluate gall bladder and liver    Preferred imaging location?:   [facility]   CBC With Diff/Platelet   Comp Met (CMET)   Return in about 4 weeks (around 10/13/2023).   Future Appointments  Date Time Provider Department Center  10/13/2023  8:15 AM Slaughterbeck, Sherline Distel, CNM [facility] None  10/24/2023  9:15 AM [facility] US  1 [facility]G None    For next visit:  Routine prenatal care    Subjective  Kim Hawkins presents today complaining of 10/10 diffuse abdominal pain. This started two days ago and she presented to urgent care. She had a urine dip that showed ketones and protein but was negative for infection. She denies n/v, constipation, diarrhea. She has been able to keep fluids and food down. She  denies any heavy lifting or event that precipitated this pain.  She has tried Tylenol without any relief. The pain is not as bad as when it initially started, but it has not improved much over the past 2 days.  Movement: Present Contractions: Not present  Objective   Flow sheet Vitals: Pulse Rate: (!) 106 BP: 138/72 Fetal Heart Rate (bpm): 150 Total weight gain: 10 lb (4.536 kg)  General Appearance  No acute distress, well appearing, and well nourished Pulmonary   Normal work of breathing Abdomen   Soft, gravid, normal BS, tender to palpation in all 4 quadrants. Neurologic   Alert and oriented to person, place, and time Psychiatric   Mood and affect within normal limits  Kim Hawkins, [facility]  09/15/23 3:21 PM

## 2023-09-15 NOTE — Assessment & Plan Note (Signed)
-   Anticipatory guidance reviewed.  - Reviewed kick counts and preterm labor warning signs. Instructed to call office or come to hospital with persistent headache, vision changes, regular contractions, leaking of fluid, decreased fetal movement or vaginal bleeding.

## 2023-09-15 NOTE — Assessment & Plan Note (Addendum)
-   Consulted Dr. Luster Salters who also examined the patient and recommended Stat ABD US .  - Collected and sent swabs and urine.  - Recommended CBC, CMP but declined today d/t needle phobia.  - If pain gets worse, or pt develops fever to present to emergency department.  - May try heating pad, warm shower/bath, tylenol, good nutrition and hydration.

## 2023-09-20 LAB — CERVICOVAGINAL ANCILLARY ONLY
Bacterial Vaginitis (gardnerella): NEGATIVE
Candida Glabrata: NEGATIVE
Candida Vaginitis: NEGATIVE
Chlamydia: NEGATIVE
Comment: NEGATIVE
Comment: NEGATIVE
Comment: NEGATIVE
Comment: NEGATIVE
Comment: NEGATIVE
Comment: NORMAL
Neisseria Gonorrhea: NEGATIVE
Trichomonas: NEGATIVE

## 2023-09-26 ENCOUNTER — Encounter: Payer: Self-pay | Admitting: Obstetrics

## 2023-09-26 ENCOUNTER — Encounter: Admitting: Licensed Practical Nurse

## 2023-10-10 ENCOUNTER — Telehealth: Payer: Self-pay | Admitting: Obstetrics

## 2023-10-10 ENCOUNTER — Other Ambulatory Visit

## 2023-10-10 NOTE — Telephone Encounter (Signed)
 Reached out to pt about ultrasound appt that was scheduled on 10/10/2023 at 8:15.  Left message for pt to call back to reschedule.

## 2023-10-11 NOTE — Telephone Encounter (Signed)
 Pt called back and was given the number for Centralized Scheduling.

## 2023-10-13 ENCOUNTER — Ambulatory Visit (INDEPENDENT_AMBULATORY_CARE_PROVIDER_SITE_OTHER): Admitting: Certified Nurse Midwife

## 2023-10-13 VITALS — BP 128/67 | HR 99 | Wt 117.7 lb

## 2023-10-13 DIAGNOSIS — Z113 Encounter for screening for infections with a predominantly sexual mode of transmission: Secondary | ICD-10-CM

## 2023-10-13 DIAGNOSIS — Z131 Encounter for screening for diabetes mellitus: Secondary | ICD-10-CM

## 2023-10-13 DIAGNOSIS — Z3402 Encounter for supervision of normal first pregnancy, second trimester: Secondary | ICD-10-CM

## 2023-10-13 DIAGNOSIS — Z130 Encounter for screening for diseases of the blood and blood-forming organs and certain disorders involving the immune mechanism: Secondary | ICD-10-CM

## 2023-10-13 DIAGNOSIS — Z3401 Encounter for supervision of normal first pregnancy, first trimester: Secondary | ICD-10-CM

## 2023-10-13 NOTE — Progress Notes (Signed)
    Return Prenatal Note   Assessment/Plan   Plan  23 y.o. G1P0 at [redacted]w[redacted]d presents for follow-up OB visit. Reviewed prenatal record including previous visit note.  Encounter for supervision of normal first pregnancy in first trimester Reviewed 28 week labs with patient to prepare her for next visit. She has a needle phobia. We also discussed the timing of other labs including on L&D. She may decline an IV in labor. Reviewed the reasons we place the IV but confirmed she can decline any intervention she is not comfortable with during labor.  Discussed epidural and the risks for long term back pain which she has heard from a lot of people. Discussed that unmedicated birth can be an empowered moment but she will need some preparation for success.  Reviewed red flag warning signs anticipatory guidance for upcoming prenatal care.     Orders Placed This Encounter  Procedures   28 Week RH+Panel    Standing Status:   Future    Expected Date:   10/25/2023    Expiration Date:   10/10/2024   No follow-ups on file.   Future Appointments  Date Time Provider Department Center  10/24/2023 10:30 AM [facility]  3 [facility]  10/27/2023  9:00 AM [facility] LAB [facility] None  10/27/2023  9:35 AM Forestine Igo, CNM [facility] None    For next visit:  ROB with 1 hour gluocla, third trimester labs, and Tdap     Subjective   22 y.o. G1P0 at [redacted]w[redacted]d presents for this follow-up prenatal visit.  Patient has some questions about L&D and pain control  Patient reports: Movement: Present Contractions: Not present  Objective   Flow sheet Vitals: Pulse Rate: 99 BP: 128/67 Fundal Height: 26 cm Fetal Heart Rate (bpm): 140 Total weight gain: 19 lb 11.2 oz (8.936 kg)  General Appearance  No acute distress, well appearing, and well nourished Pulmonary   Normal work of breathing Neurologic   Alert and oriented to person, place, and time Psychiatric   Mood and affect within normal limits  Donato Fu, CNM  10/12/2508:13 AM

## 2023-10-13 NOTE — Assessment & Plan Note (Signed)
 Reviewed 28 week labs with patient to prepare her for next visit. She has a needle phobia. We also discussed the timing of other labs including on L&D. She may decline an IV in labor. Reviewed the reasons we place the IV but confirmed she can decline any intervention she is not comfortable with during labor.  Discussed epidural and the risks for long term back pain which she has heard from a lot of people. Discussed that unmedicated birth can be an empowered moment but she will need some preparation for success.  Reviewed red flag warning signs anticipatory guidance for upcoming prenatal care.

## 2023-10-24 ENCOUNTER — Ambulatory Visit
Admission: RE | Admit: 2023-10-24 | Discharge: 2023-10-24 | Disposition: A | Discharge status: Home / Self Care | Source: Ambulatory Visit | Attending: Obstetrics | Admitting: Obstetrics

## 2023-10-24 ENCOUNTER — Other Ambulatory Visit

## 2023-10-24 DIAGNOSIS — Z3689 Encounter for other specified antenatal screening: Secondary | ICD-10-CM | POA: Diagnosis present

## 2023-10-27 ENCOUNTER — Other Ambulatory Visit

## 2023-10-27 ENCOUNTER — Ambulatory Visit (INDEPENDENT_AMBULATORY_CARE_PROVIDER_SITE_OTHER): Admitting: Certified Nurse Midwife

## 2023-10-27 ENCOUNTER — Encounter: Payer: Self-pay | Admitting: Certified Nurse Midwife

## 2023-10-27 VITALS — BP 116/71 | HR 87 | Wt 121.6 lb

## 2023-10-27 DIAGNOSIS — Z131 Encounter for screening for diabetes mellitus: Secondary | ICD-10-CM

## 2023-10-27 DIAGNOSIS — Z113 Encounter for screening for infections with a predominantly sexual mode of transmission: Secondary | ICD-10-CM

## 2023-10-27 DIAGNOSIS — R300 Dysuria: Secondary | ICD-10-CM

## 2023-10-27 DIAGNOSIS — O365930 Maternal care for other known or suspected poor fetal growth, third trimester, not applicable or unspecified: Secondary | ICD-10-CM | POA: Diagnosis not present

## 2023-10-27 DIAGNOSIS — Z3403 Encounter for supervision of normal first pregnancy, third trimester: Secondary | ICD-10-CM

## 2023-10-27 DIAGNOSIS — Z130 Encounter for screening for diseases of the blood and blood-forming organs and certain disorders involving the immune mechanism: Secondary | ICD-10-CM

## 2023-10-27 DIAGNOSIS — O365990 Maternal care for other known or suspected poor fetal growth, unspecified trimester, not applicable or unspecified: Secondary | ICD-10-CM | POA: Insufficient documentation

## 2023-10-27 DIAGNOSIS — Z3A28 28 weeks gestation of pregnancy: Secondary | ICD-10-CM | POA: Diagnosis not present

## 2023-10-27 DIAGNOSIS — Z3402 Encounter for supervision of normal first pregnancy, second trimester: Secondary | ICD-10-CM

## 2023-10-27 NOTE — Assessment & Plan Note (Signed)
 Ultrasound report not yet finalized, images & measurements reviewed & EFW noted to be 4.9%, MFM referral placed.

## 2023-10-27 NOTE — Progress Notes (Signed)
    Return Prenatal Note   Subjective   23 y.o. G1P0 at [redacted]w[redacted]d presents for this follow-up prenatal visit.  Patient feeling nauseated after glucose drink, active baby. Ultrasound for growth 5/19, report not yet available. Patient reports: Movement: Present Contractions: Not present  Objective   Flow sheet Vitals: Pulse Rate: 87 BP: 116/71 Fundal Height: 27 cm Fetal Heart Rate (bpm): 140 Total weight gain: 23 lb 9.6 oz (10.7 kg)  General Appearance  No acute distress, well appearing, and well nourished Pulmonary   Normal work of breathing Neurologic   Alert and oriented to person, place, and time Psychiatric   Mood and affect within normal limits  Assessment/Plan   Plan  23 y.o. G1P0 at [redacted]w[redacted]d presents for follow-up OB visit. Reviewed prenatal record including previous visit note.  Encounter for supervision of normal first pregnancy in third trimester Reviewed kick counts and preterm labor warning signs. Instructed to call office or come to hospital with persistent headache, vision changes, regular contractions, leaking of fluid, decreased fetal movement or vaginal bleeding.   Fetal growth restriction antepartum Ultrasound report not yet finalized, images & measurements reviewed & EFW noted to be 4.9%, MFM referral placed.      Orders Placed This Encounter  Procedures   Urine Culture   US  MFM OB DETAIL +14 WK    Standing Status:   Future    Expected Date:   10/28/2023    Expiration Date:   10/26/2024    Reason for Exam (SYMPTOM  OR DIAGNOSIS REQUIRED):   EFW 4% at 28w    Preferred Location:   Perin[facility] Consultation Clinic @ [facility]   Urinalysis, Routine w reflex microscopic   Return in 2 weeks (on 11/10/2023) for ROB.   Future Appointments  Date Time Provider Department Center  11/09/2023  8:55 AM Angelita Kendall, CNM [facility] None    For next visit:  continue with routine prenatal care, MFM referral for ultrasound & monitoring of fetal growth ordered.      Forestine Igo, CNM  10/26/2509:45 AM

## 2023-10-27 NOTE — Assessment & Plan Note (Signed)
 Reviewed kick counts and preterm labor warning signs. Instructed to call office or come to hospital with persistent headache, vision changes, regular contractions, leaking of fluid, decreased fetal movement or vaginal bleeding.

## 2023-10-27 NOTE — Patient Instructions (Signed)
 Third Trimester of Pregnancy  The third trimester of pregnancy is from week 28 through week 40. This is months 7 through 9. The third trimester is a time when your baby is growing fast. Body changes during your third trimester Your body continues to change during this time. The changes usually go away after your baby is born. Physical changes You will continue to gain weight. You may get stretch marks on your hips, belly, and breasts. Your breasts will keep growing and may hurt. A yellow fluid (colostrum) may leak from your breasts. This is the first milk you're making for your baby. Your hair may grow faster and get thicker. In some cases, you may get hair loss. Your belly button may stick out. You may have more swelling in your hands, face, or ankles. Health changes You may have heartburn. You may feel short of breath. This is caused by the uterus that is now bigger. You may have more aches in the pelvis, back, or thighs. You may have more tingling or numbness in your hands, arms, and legs. You may pee more often. You may have trouble pooping (constipation) or swollen veins in the butt that can itch or get painful (hemorrhoids). Other changes You may have more problems sleeping. You may notice the baby moving lower in your belly (dropping). You may have more fluid coming from your vagina. Your joints may feel loose, and you may have pain around your pelvic bone. Follow these instructions at home: Medicines Take medicines only as told by your health care provider. Some medicines are not safe during pregnancy. Your provider may change the medicines that you take. Do not take any medicines unless told to by your provider. Take a prenatal vitamin that has at least 600 micrograms (mcg) of folic acid. Do not use herbal medicines, illegal drugs, or medicines that are not approved by your provider. Eating and drinking While you're pregnant your body needs additional nutrition to help  support your growing baby. Talk with your provider about your nutritional needs. Activity Most women are able to exercise regularly during pregnancy. Exercise routines may need to change at the end of your pregnancy. Talk to your provider about your activities and exercise routine. Relieving pain and discomfort Rest often with your legs raised if you have leg cramps or low back pain. Take warm sitz baths to soothe pain from hemorrhoids. Use hemorrhoid cream if your provider says it's okay. Wear a good, supportive bra if your breasts hurt. Do not use hot tubs, steam rooms, or saunas. Do not douche. Do not use tampons or scented pads. Safety Talk to your provider before traveling far distances. Wear your seatbelt at all times when you're in a car. Talk to your provider if someone hits you, hurts you, or yells at you. Preparing for birth To prepare for your baby: Take childbirth and breastfeeding classes. Visit the hospital and tour the maternity area. Buy a rear-facing car seat. Learn how to install it in your car. General instructions Avoid cat litter boxes and soil used by cats. These things carry germs that can cause harm to your pregnancy and your baby. Do not drink alcohol, smoke, vape, or use products with nicotine or tobacco in them. If you need help quitting, talk with your provider. Keep all follow-up visits for your third trimester. Your provider will do more exams and tests during this trimester. Write down your questions. Take them to your prenatal visits. Your provider also will: Talk with you about  your overall health. Give you advice or refer you to specialists who can help with different needs, including: Mental health and counseling. Foods and healthy eating. Ask for help if you need help with food. Where to find more information American Pregnancy Association: americanpregnancy.org [employer] of Obstetricians and Gynecologists: acog.org Office on [employer] Health:  [employer] Contact a health care provider if: You have a headache that does not go away when you take medicine. You have any of these problems: You can't eat or drink. You have nausea and vomiting. You have watery poop (diarrhea) for 2 days or more. You have pain when you pee, or your pee smells bad. You have been sick for 2 days or more and aren't getting better. Contact your provider right away if: You have any of these coming from your vagina: Abnormal discharge. Bad-smelling fluid. Bleeding. Your baby is moving less than usual. You have signs of labor: You have any contractions, belly cramping, or have pain in your pelvis or lower back before 37 weeks of pregnancy (preterm labor). You have regular contractions that are less than 5 minutes apart. Your water breaks. You have symptoms of high blood pressure or preeclampsia. These include: A severe, throbbing headache that does not go away. Sudden or extreme swelling of your face, hands, legs, or feet. Vision problems: You see spots. You have blurry vision. Your eyes are sensitive to light. If you can't reach your provider, go to an urgent care or emergency room. Get help right away if: You faint, become confused, or can't think clearly. You have chest pain or trouble breathing. You have any kind of injury, such as from a fall or a car crash. These symptoms may be an emergency. Call 911 right away. Do not wait to see if the symptoms will go away. Do not drive yourself to the hospital. This information is not intended to replace advice given to you by your health care provider. Make sure you discuss any questions you have with your health care provider. Document Revised: 02/24/2023 Document Reviewed: 09/24/2022 Elsevier Patient Education  2024 [employer].

## 2023-10-28 ENCOUNTER — Ambulatory Visit: Payer: Self-pay | Admitting: Certified Nurse Midwife

## 2023-10-28 LAB — URINALYSIS, ROUTINE W REFLEX MICROSCOPIC
Bilirubin, UA: NEGATIVE
Ketones, UA: NEGATIVE
Nitrite, UA: NEGATIVE
Protein,UA: NEGATIVE
RBC, UA: NEGATIVE
Specific Gravity, UA: 1.013 (ref 1.005–1.030)
Urobilinogen, Ur: 0.2 mg/dL (ref 0.2–1.0)
pH, UA: 6.5 (ref 5.0–7.5)

## 2023-10-28 LAB — 28 WEEK RH+PANEL
Basophils Absolute: 0 10*3/uL (ref 0.0–0.2)
Basos: 0 %
EOS (ABSOLUTE): 0.2 10*3/uL (ref 0.0–0.4)
Eos: 1 %
Gestational Diabetes Screen: 132 mg/dL (ref 70–139)
HIV Screen 4th Generation wRfx: NONREACTIVE
Hematocrit: 35.8 % (ref 34.0–46.6)
Hemoglobin: 11.5 g/dL (ref 11.1–15.9)
Immature Grans (Abs): 0.1 10*3/uL (ref 0.0–0.1)
Immature Granulocytes: 1 %
Lymphocytes Absolute: 2.2 10*3/uL (ref 0.7–3.1)
Lymphs: 18 %
MCH: 31.4 pg (ref 26.6–33.0)
MCHC: 32.1 g/dL (ref 31.5–35.7)
MCV: 98 fL — ABNORMAL HIGH (ref 79–97)
Monocytes Absolute: 0.7 10*3/uL (ref 0.1–0.9)
Monocytes: 6 %
Neutrophils Absolute: 8.9 10*3/uL — ABNORMAL HIGH (ref 1.4–7.0)
Neutrophils: 74 %
Platelets: 298 10*3/uL (ref 150–450)
RBC: 3.66 x10E6/uL — ABNORMAL LOW (ref 3.77–5.28)
RDW: 12.2 % (ref 11.7–15.4)
RPR Ser Ql: NONREACTIVE
WBC: 12.2 10*3/uL — ABNORMAL HIGH (ref 3.4–10.8)

## 2023-10-28 LAB — MICROSCOPIC EXAMINATION
Casts: NONE SEEN /LPF
Epithelial Cells (non renal): >10 /HPF — AB (ref 0–10)
RBC, Urine: NONE SEEN /HPF (ref 0–2)

## 2023-10-29 ENCOUNTER — Ambulatory Visit: Payer: Self-pay | Admitting: Certified Nurse Midwife

## 2023-10-29 LAB — URINE CULTURE

## 2023-11-03 ENCOUNTER — Ambulatory Visit

## 2023-11-03 ENCOUNTER — Other Ambulatory Visit

## 2023-11-09 ENCOUNTER — Encounter: Payer: Self-pay | Admitting: Advanced Practice Midwife

## 2023-11-09 ENCOUNTER — Ambulatory Visit (INDEPENDENT_AMBULATORY_CARE_PROVIDER_SITE_OTHER): Admitting: Advanced Practice Midwife

## 2023-11-09 VITALS — BP 125/82 | HR 92 | Wt 122.6 lb

## 2023-11-09 DIAGNOSIS — J4541 Moderate persistent asthma with (acute) exacerbation: Secondary | ICD-10-CM | POA: Diagnosis not present

## 2023-11-09 DIAGNOSIS — O365990 Maternal care for other known or suspected poor fetal growth, unspecified trimester, not applicable or unspecified: Secondary | ICD-10-CM

## 2023-11-09 DIAGNOSIS — Z3A30 30 weeks gestation of pregnancy: Secondary | ICD-10-CM | POA: Diagnosis not present

## 2023-11-09 DIAGNOSIS — O0993 Supervision of high risk pregnancy, unspecified, third trimester: Secondary | ICD-10-CM

## 2023-11-09 MED ORDER — ALBUTEROL SULFATE HFA 108 (90 BASE) MCG/ACT IN AERS: 1.0000 | INHALATION_SPRAY | Freq: Four times a day (QID) | RESPIRATORY_TRACT | 1 refills | Status: AC | PRN

## 2023-11-09 NOTE — Progress Notes (Signed)
 Routine Prenatal Care Visit  Subjective  Kim Hawkins is a 23 y.o. G1P0 at [redacted]w[redacted]d being seen today for ongoing prenatal care.  She is currently monitored for the following issues for this high-risk pregnancy and has Supervision of high-risk pregnancy; ADHD (attention deficit hyperactivity disorder); Mild intermittent asthma, uncomplicated; Moderate persistent asthma with acute exacerbation; and Fetal growth restriction antepartum on their problem list.  ----------------------------------------------------------------------------------- Patient reports unable to keep MFM appointment due to car trouble and distance to visit in [place]. Reviewed reason for MFM visit and importance of following up. She is encouraged to call to reschedule asap for [facility] location. Also advised increase intake of  healthy calorie dense foods. She admits good appetite and denies nausea. She needs refill of inhaler.   Contractions: Not present. Vag. Bleeding: None.  Movement: Present. Leaking Fluid denies.  ----------------------------------------------------------------------------------- The following portions of the patient's history were reviewed and updated as appropriate: allergies, current medications, past family history, past medical history, past social history, past surgical history and problem list. Problem list updated.  Objective  Blood pressure 125/82, pulse 92, weight 122 lb 9.6 oz (55.6 kg), last menstrual period 03/13/2023. Pregravid weight 98 lb (44.5 kg) Total Weight Gain 24 lb 9.6 oz (11.2 kg) Urinalysis: Urine Protein    Urine Glucose    Fetal Status: Fetal Heart Rate (bpm): 143 Fundal Height: 29 cm Movement: Present     General:  Alert, oriented and cooperative. Patient is in no acute distress.  Skin: Skin is warm and dry. No rash noted.   Cardiovascular: Normal heart rate noted  Respiratory: Normal respiratory effort, no problems with respiration noted  Abdomen: Soft, gravid, appropriate  for gestational age. Pain/Pressure: Absent     Pelvic:  Cervical exam deferred        Extremities: Normal range of motion.  Edema: None  Mental Status: Normal mood and affect. Normal behavior. Normal judgment and thought content.   Assessment   23 y.o. G1P0 at [redacted]w[redacted]d by  01/17/2024, by Ultrasound presenting for routine prenatal visit  Plan   G1 Problems (from 06/27/23 to present)     Problem Noted Diagnosed Resolved   Supervision of high-risk pregnancy 06/27/2023 by Lendon Queen, CMA  No   Overview Addendum 09/01/2023  8:59 AM by Frederich Jaksch, CNM   Clinical Staff Provider  Office Location  [facility] Dating  12/18/2023, by Last Menstrual Period  Language  English Anatomy US   Normal   Flu Vaccine  Declined Genetic Screen  NIPS: negative   TDaP vaccine  Declined Hgb A1C or  GTT Early : Third trimester :   Covid    LAB RESULTS   Rhogam  A/Positive/-- (03/05 1610)  Blood Type A/Positive/-- (03/05 0811)   RSV Offer Antibody Negative (03/05 0811)  Feeding Plan Formula Rubella 4.20 (03/05 9604)  Contraception None RPR Non Reactive (03/05 5409)   Circumcision If applicable HBsAg Negative (03/05 0811)   Pediatrician  Unsure HIV Non Reactive (03/05 0811)  Support Person Kyle/Doula Varicella Non Reactive (03/05 8119)  Prenatal Classes yes GBS  (For PCN allergy, check sensitivities)     Hep C Non Reactive (03/05 0811)   BTL Consent  Pap No results found for: "DIAGPAP"  VBAC Consent  Hgb Electro      CF      SMA                    Preterm labor symptoms and general obstetric precautions including but not limited  to vaginal bleeding, contractions, leaking of fluid and fetal movement were reviewed in detail with the patient. Please refer to After Visit Summary for other counseling recommendations.   Return in about 2 weeks (around 11/23/2023) for rob- also needs to reschedule appointment with MFM asap.  Angelita Kendall, CNM 11/09/2023 9:15 AM

## 2023-11-09 NOTE — Patient Instructions (Signed)
 Third Trimester of Pregnancy  The third trimester of pregnancy is from week 28 through week 40. This is months 7 through 9. The third trimester is a time when your baby is growing fast. Body changes during your third trimester Your body continues to change during this time. The changes usually go away after your baby is born. Physical changes You will continue to gain weight. You may get stretch marks on your hips, belly, and breasts. Your breasts will keep growing and may hurt. A yellow fluid (colostrum) may leak from your breasts. This is the first milk you're making for your baby. Your hair may grow faster and get thicker. In some cases, you may get hair loss. Your belly button may stick out. You may have more swelling in your hands, face, or ankles. Health changes You may have heartburn. You may feel short of breath. This is caused by the uterus that is now bigger. You may have more aches in the pelvis, back, or thighs. You may have more tingling or numbness in your hands, arms, and legs. You may pee more often. You may have trouble pooping (constipation) or swollen veins in the butt that can itch or get painful (hemorrhoids). Other changes You may have more problems sleeping. You may notice the baby moving lower in your belly (dropping). You may have more fluid coming from your vagina. Your joints may feel loose, and you may have pain around your pelvic bone. Follow these instructions at home: Medicines Take medicines only as told by your health care provider. Some medicines are not safe during pregnancy. Your provider may change the medicines that you take. Do not take any medicines unless told to by your provider. Take a prenatal vitamin that has at least 600 micrograms (mcg) of folic acid. Do not use herbal medicines, illegal drugs, or medicines that are not approved by your provider. Eating and drinking While you're pregnant your body needs additional nutrition to help  support your growing baby. Talk with your provider about your nutritional needs. Activity Most women are able to exercise regularly during pregnancy. Exercise routines may need to change at the end of your pregnancy. Talk to your provider about your activities and exercise routine. Relieving pain and discomfort Rest often with your legs raised if you have leg cramps or low back pain. Take warm sitz baths to soothe pain from hemorrhoids. Use hemorrhoid cream if your provider says it's okay. Wear a good, supportive bra if your breasts hurt. Do not use hot tubs, steam rooms, or saunas. Do not douche. Do not use tampons or scented pads. Safety Talk to your provider before traveling far distances. Wear your seatbelt at all times when you're in a car. Talk to your provider if someone hits you, hurts you, or yells at you. Preparing for birth To prepare for your baby: Take childbirth and breastfeeding classes. Visit the hospital and tour the maternity area. Buy a rear-facing car seat. Learn how to install it in your car. General instructions Avoid cat litter boxes and soil used by cats. These things carry germs that can cause harm to your pregnancy and your baby. Do not drink alcohol, smoke, vape, or use products with nicotine or tobacco in them. If you need help quitting, talk with your provider. Keep all follow-up visits for your third trimester. Your provider will do more exams and tests during this trimester. Write down your questions. Take them to your prenatal visits. Your provider also will: Talk with you about  your overall health. Give you advice or refer you to specialists who can help with different needs, including: Mental health and counseling. Foods and healthy eating. Ask for help if you need help with food. Where to find more information American Pregnancy Association: americanpregnancy.org [employer] of Obstetricians and Gynecologists: acog.org Office on [employer] Health:  TravelLesson.ca Contact a health care provider if: You have a headache that does not go away when you take medicine. You have any of these problems: You can't eat or drink. You have nausea and vomiting. You have watery poop (diarrhea) for 2 days or more. You have pain when you pee, or your pee smells bad. You have been sick for 2 days or more and aren't getting better. Contact your provider right away if: You have any of these coming from your vagina: Abnormal discharge. Bad-smelling fluid. Bleeding. Your baby is moving less than usual. You have signs of labor: You have any contractions, belly cramping, or have pain in your pelvis or lower back before 37 weeks of pregnancy (preterm labor). You have regular contractions that are less than 5 minutes apart. Your water breaks. You have symptoms of high blood pressure or preeclampsia. These include: A severe, throbbing headache that does not go away. Sudden or extreme swelling of your face, hands, legs, or feet. Vision problems: You see spots. You have blurry vision. Your eyes are sensitive to light. If you can't reach your provider, go to an urgent care or emergency room. Get help right away if: You faint, become confused, or can't think clearly. You have chest pain or trouble breathing. You have any kind of injury, such as from a fall or a car crash. These symptoms may be an emergency. Call 911 right away. Do not wait to see if the symptoms will go away. Do not drive yourself to the hospital. This information is not intended to replace advice given to you by your health care provider. Make sure you discuss any questions you have with your health care provider. Document Revised: 02/24/2023 Document Reviewed: 09/24/2022 Elsevier Patient Education  2024 Elsevier Inc.Pregnancy: Healthy Eating While you're pregnant, your body needs extra nutrition for your growing baby. You also need more vitamins and minerals, such as folic acid,  calcium, iron, and vitamin D. Eating a balanced diet is important for both you and your baby. Your need for extra calories will change during pregnancy. During the first 3 months of pregnancy, called the first trimester, you don't need more calories. During the second trimester, you'll need about 340 extra calories a day. During the third trimester, you'll need about 450 extra calories a day. If you're carrying more than one baby, talk with your health care provider or a dietitian to learn more about your specific eating needs. What are tips for eating healthy during pregnancy? Meal planning  Eating smaller meals throughout the day may help manage some side effects common in pregnancy, like heartburn and reflux. Eat a variety of foods. Be sure to include many types of fruits and vegetables. Two or more servings of fish are recommended each week. Choose fish that are lower in mercury, such as salmon and pollock. Limit foods that have "empty calories." These are foods that have little nutritional value, such as sweets, desserts, candies, and drinks with sugar in them. Drinks that have caffeine are OK to drink, but it's better to avoid caffeine. Limit your total caffeine intake to less than 200 mg each day, or the limit you're told by your  provider. Be aware that 200 mg of caffeine is 12 oz or 355 mL of coffee, tea, or soda. General information Take a prenatal vitamin to help meet your vitamin and mineral needs during pregnancy. This includes your need for folic acid, iron, calcium, and vitamin D. Do not try to lose weight or go on a diet during pregnancy. Food safety  Wash your hands before you eat and after you prepare raw meat. Wash all fruits and vegetables well before peeling or eating. Make sure that all meats, poultry, and eggs are cooked to food-safe temperatures or "well-done." Taking these actions can help keep your food safe and protect you and your baby from dangerous food illnesses.  Ask your provider for more information. What foods should I eat? Fruits All fruits. Eat a variety of colors and types of fruit. Remember to wash your fruits well before peeling or eating. Vegetables All vegetables. Eat a variety of colors and types of vegetables. Remember to wash your vegetables well before peeling or eating. Grains All grains. Choose whole grains, such as whole-wheat bread, oatmeal, or brown rice. Meats and other protein foods Lean meats, including chicken, [place], and lean cuts of beef, veal, or pork. Fish that is higher in omega-3 fatty acids and lower in mercury, such as salmon, herring, mussels, trout, sardines, pollock, shrimp, crab, and lobster. Tofu. Tempeh. Beans. Eggs. Peanut butter and other nut butters. Dairy Pasteurized milk and milk alternatives, such as soy milk. Pasteurized yogurt and pasteurized cheese. Cottage cheese. Sour cream. Beverages Water. Juices that contain 100% fruit juice or vegetable juice. Caffeine-free teas and decaffeinated coffee. Fats and oils Fats and oils are OK to include in moderation. Sweets and desserts Sweets and desserts are OK to include in moderation. Seasoning and other foods All pasteurized condiments. The items listed above may not be all the foods and drinks you can have. Talk with a dietitian to learn more. What does 340 extra calories look like? Healthy snacks that give you 340 more calories a day could be: Peanut butter and jelly with milk: 8 oz (237 mL) of low-fat milk. Peanut butter and jelly sandwich made with: 1 slice of whole-wheat bread. 2 teaspoons (10 g) of peanut butter. Yogurt and berries: 1 cup (245 g) of [place] yogurt. 1 cup (150 g) of berries. 2 tablespoons (30 g) of chopped nuts, such as almonds or walnuts. Avocado toast: 1 slice of whole-wheat bread. 1/2 medium avocado (70 g). 1 large egg (50 g). What foods should I avoid? Fruits Raw (unpasteurized) fruit juices. Vegetables Unpasteurized  vegetable juices. Meats and other protein foods Precooked or cured meat, such as bologna, hot dogs, sausages, or meat loaves. (If you must eat those meats, reheat them until they are steaming hot.) Refrigerated pate, meat spreads from a meat counter, or smoked seafood that's found in the refrigerated section of a store. Raw or undercooked meats, poultry, and eggs. Raw fish, such as sushi or sashimi. Fish that have high mercury content, such as tilefish, shark, swordfish, and king mackerel. Dairy Unpasteurized or raw milk and any foods that are made from them. Some of these may be: Homemade yogurts or puddings. Soft cheeses such as: Feta. Queso blanco or fresco. Pharmacist, hospital or [place]. Blue-veined cheeses. Some of these types of cheeses may be made with pasteurized milk. Check the label. If pasteurized milk is used, they are OK to eat during pregnancy. Deli foods Premade foods from a store or deli, like chicken salad, coleslaw, or egg salad. These are  riskier for food illness than fresh or homemade salads. Beverages Alcohol. Sugar-sweetened drinks, such as sodas or teas. Energy drinks. Seasoning and other foods Homemade fermented foods and drinks, such as: Pickles. Sauerkraut. Kombucha. Store-bought pasteurized versions of these are OK. The items listed above may not be all the foods and drinks you should avoid. Talk with a dietitian to learn more. Where to find more information To learn more, go to: Centers for Disease Control and Prevention at TonerPromos.no. Click "Search" and type "food choices for pregnancy." Find the link you need. MyPlate at http://pittman-dennis.biz/. This information is not intended to replace advice given to you by your health care provider. Make sure you discuss any questions you have with your health care provider. Document Revised: 05/11/2023 Document Reviewed: 05/11/2023 Elsevier Patient Education  2025 Elsevier Inc.Fetal Growth Restriction: What to Know  Fetal  growth restriction (FGR) is when a baby is not growing normally during pregnancy. A baby with FGR is smaller than they should be and may weigh less than normal at birth. Babies with FGR are at higher risk for early delivery. They may also need more care than usual after birth. What are the causes? Problems with the placenta or umbilical cord. This problem causes the fetus to get less oxygen or nutrition. Not enough food for the mother during pregnancy. Not enough weight gain for the mother during pregnancy. Smoking, drinking alcohol, and taking drugs. Some medicines. Problems that a baby may get before they're born (congenital). Problems that are passed in families (genetic). Being pregnant with twins or more. Infection. What increases the risk? You're older than age 76 or younger than age 46. You have certain medical conditions, such as: High blood pressure. Preeclampsia. Diabetes. Heart or kidney disease. Anemia. You live at a very high altitude. You have a history of FGR, or have a genetic disorder. You have a family history of FGR, or genetic disorders. You had treatments to help you get pregnant. What are the signs or symptoms? FGR does not cause many symptoms. Your health care provider may suspect FGR if your belly is not as big as it should be for the stage of your pregnancy. How is this diagnosed? FGR is diagnosed with physical exams and prenatal exams. Your provider may: Measure your uterus. Measure your baby using ultrasound. Do a test to check the fluid around the baby in the uterus. This will show any infection or problems that can develop before the baby is born. Do tests to check blood flow to the placenta and to the baby. How is this treated? Your provider will treat the cause of FGR. Your pregnancy will be closely watched. If your baby is not getting enough blood because of a problem with the placenta, your baby may need to be delivered early. Follow these  instructions at home: Medicines Take your medicines only as told. This includes vitamins and supplements. Ask your provider before you take or use any medicines, supplements, vitamins, eye drops, and creams. General instructions Eat a healthy diet. Work with your health care provider or a dietitian to make sure that: You are getting enough nutrients. You are gaining enough weight during your pregnancy. Rest as needed. Try to get at least 8 hours of sleep every night. Do not drink alcohol or use drugs. Do not smoke, vape, or use nicotine or tobacco. Keep all follow-up visits. Your provider needs to check your health and your baby's growth. Contact a health care provider if: Your baby is moving less  than usual. You have cramps in your belly or have pain in your hips or lower back. You have a sudden, sharp pain in the belly. You have a gush or trickle of fluid from your vagina and you cannot stop it. You have signs of infection, including a fever. You have bleeding from your vagina. You have symptoms of high blood pressure or preeclampsia. These include: A headache that doesn't go away when you take medicine. Sudden or extreme swelling of your face, hands, legs, or feet. Vision problems, such as: You see spots. You have blurry vision. You're sensitive to light. Get help right away if: You faint, have a seizure, or cannot think clearly. You have chest pain or trouble breathing. You have any of these symptoms and you're unable to reach your provider: Symptoms of infection, including a fever. You have bleeding from your vagina. You have symptoms of high blood pressure or preeclampsia. These symptoms may be an emergency. Call 911 right away. Do not wait to see if the symptoms will go away. Do not drive yourself to the hospital. This information is not intended to replace advice given to you by your health care provider. Make sure you discuss any questions you have with your health care  provider. Document Revised: 10/25/2022 Document Reviewed: 10/25/2022 Elsevier Patient Education  2024 [employer].

## 2023-11-18 ENCOUNTER — Telehealth: Payer: Self-pay

## 2023-11-22 ENCOUNTER — Telehealth: Payer: Self-pay

## 2023-11-23 ENCOUNTER — Ambulatory Visit (INDEPENDENT_AMBULATORY_CARE_PROVIDER_SITE_OTHER): Admitting: Obstetrics

## 2023-11-23 ENCOUNTER — Other Ambulatory Visit

## 2023-11-23 VITALS — BP 121/75 | HR 93 | Wt 122.0 lb

## 2023-11-23 DIAGNOSIS — Z3A32 32 weeks gestation of pregnancy: Secondary | ICD-10-CM | POA: Diagnosis not present

## 2023-11-23 DIAGNOSIS — O0993 Supervision of high risk pregnancy, unspecified, third trimester: Secondary | ICD-10-CM

## 2023-11-23 DIAGNOSIS — O365930 Maternal care for other known or suspected poor fetal growth, third trimester, not applicable or unspecified: Secondary | ICD-10-CM | POA: Diagnosis not present

## 2023-11-23 DIAGNOSIS — O365990 Maternal care for other known or suspected poor fetal growth, unspecified trimester, not applicable or unspecified: Secondary | ICD-10-CM

## 2023-11-23 DIAGNOSIS — O0990 Supervision of high risk pregnancy, unspecified, unspecified trimester: Secondary | ICD-10-CM

## 2023-11-23 DIAGNOSIS — Z3A31 31 weeks gestation of pregnancy: Secondary | ICD-10-CM

## 2023-11-23 NOTE — Assessment & Plan Note (Signed)
-  FGR resolved. US  today shows growth in the 37.8th percentile.

## 2023-11-23 NOTE — Assessment & Plan Note (Signed)
-  Encouraged CBE - [employer], [employer] series if unable to attend class -Discussed physiologic discharge in pregnancy and s/s of infection -Reviewed kick counts and preterm labor warning signs. Instructed to call office or come to hospital with persistent headache, vision changes, regular contractions, leaking of fluid, decreased fetal movement or vaginal bleeding.

## 2023-11-23 NOTE — Progress Notes (Signed)
    Return Prenatal Note   Assessment/Plan   Plan  23 y.o. G1P0 at [redacted]w[redacted]d presents for follow-up OB visit. Reviewed prenatal record including previous visit note.  Fetal growth restriction antepartum -FGR resolved. US  today shows growth in the 37.8th percentile.   Supervision of high-risk pregnancy -Encouraged CBE - Laugh [employer] Learn, [employer] series if unable to attend class -Discussed physiologic discharge in pregnancy and s/s of infection -Reviewed kick counts and preterm labor warning signs. Instructed to call office or come to hospital with persistent headache, vision changes, regular contractions, leaking of fluid, decreased fetal movement or vaginal bleeding.     Orders Placed This Encounter  Procedures   US  OB Follow Up    Standing Status:   Future    Number of Occurrences:   1    Expiration Date:   11/19/2024    Reason for Exam (SYMPTOM  OR DIAGNOSIS REQUIRED):   Growth    Preferred Imaging Location?:   Internal   No follow-ups on file.   Future Appointments  Date Time Provider Department Center  12/07/2023  1:15 PM Phylliss Brenner, CNM [facility] None  12/21/2023 12:45 PM [facility] CONSULT RM [facility] None  12/21/2023  1:00 PM [facility] US1 [facility]    For next visit:  Routine prenatal care    Subjective   Kim Hawkins has been having some increased discharge with no odor or itching. A growth scan has been scheduled with MFM but it is not until 12/21/23. She is able to stay for a growth US  today in the office. She has a car seat. She will be taking the baby to [facility]: Present Contractions: Not present  Objective   Flow sheet Vitals: Pulse Rate: 93 BP: 121/75 Fundal Height: 29 cm Fetal Heart Rate (bpm): 125 Total weight gain: 24 lb (10.9 kg)  General Appearance  No acute distress, well appearing, and well nourished Pulmonary   Normal work of breathing Neurologic   Alert and oriented to person, place, and time Psychiatric   Mood and affect  within normal limits  Kim Hawkins, CNM 11/23/23

## 2023-12-07 ENCOUNTER — Ambulatory Visit (INDEPENDENT_AMBULATORY_CARE_PROVIDER_SITE_OTHER): Admitting: Obstetrics

## 2023-12-07 ENCOUNTER — Encounter: Payer: Self-pay | Admitting: Obstetrics

## 2023-12-07 VITALS — BP 125/68 | HR 85 | Wt 125.0 lb

## 2023-12-07 DIAGNOSIS — O0993 Supervision of high risk pregnancy, unspecified, third trimester: Secondary | ICD-10-CM | POA: Diagnosis not present

## 2023-12-07 DIAGNOSIS — O0990 Supervision of high risk pregnancy, unspecified, unspecified trimester: Secondary | ICD-10-CM

## 2023-12-07 DIAGNOSIS — Z3A34 34 weeks gestation of pregnancy: Secondary | ICD-10-CM | POA: Diagnosis not present

## 2023-12-07 DIAGNOSIS — F909 Attention-deficit hyperactivity disorder, unspecified type: Secondary | ICD-10-CM

## 2023-12-07 DIAGNOSIS — J4541 Moderate persistent asthma with (acute) exacerbation: Secondary | ICD-10-CM

## 2023-12-07 NOTE — Progress Notes (Signed)
    Return Prenatal Note   Assessment/Plan   Plan  23 y.o. G1P0 at [redacted]w[redacted]d presents for follow-up OB visit. Reviewed prenatal record including previous visit note.  Supervision of high-risk pregnancy - Anticipatory guidance reviewed, along with GBS and G/C swabs at next visit.  - Discussed comfort measures for pelvic pain.  - Reviewed sciatica and stretches to minimize discomfort. Discussed chiropractic care but patient will try stretches and report back.  - Reviewed kick counts and preterm labor warning signs. Instructed to call office or come to hospital with persistent headache, vision changes, regular contractions, leaking of fluid, decreased fetal movement or vaginal bleeding.      No orders of the defined types were placed in this encounter.  Return in about 2 weeks (around 12/21/2023).   Future Appointments  Date Time Provider Department Center  12/21/2023 12:45 PM [facility] CONSULT RM [facility] None  12/21/2023  1:00 PM [facility] US1 [facility]  12/21/2023  3:35 PM Kim Hawkins HERO, CNM [facility] None  12/28/2023  1:55 PM Kim Hawkins, CNM [facility] None    For next visit:  Routine prenatal care    Subjective  Kim Hawkins is feeling well overall, she has increased pelvic pressure and sciatica pain.   Movement: Present Contractions: Not present  Objective   Flow sheet Vitals: Pulse Rate: 85 BP: 125/68 Fundal Height: 32 cm Fetal Heart Rate (bpm): 142 Total weight gain: 12.2 kg  General Appearance  No acute distress, well appearing, and well nourished Pulmonary   Normal work of breathing Neurologic   Alert and oriented to person, place, and time Psychiatric   Mood and affect within normal limits  Kim Hawkins ISLE 12/07/23 1:45 PM

## 2023-12-07 NOTE — Assessment & Plan Note (Signed)
-   Anticipatory guidance reviewed, along with GBS and G/C swabs at next visit.  - Discussed comfort measures for pelvic pain.  - Reviewed sciatica and stretches to minimize discomfort. Discussed chiropractic care but patient will try stretches and report back.  - Reviewed kick counts and preterm labor warning signs. Instructed to call office or come to hospital with persistent headache, vision changes, regular contractions, leaking of fluid, decreased fetal movement or vaginal bleeding.

## 2023-12-21 ENCOUNTER — Other Ambulatory Visit (HOSPITAL_COMMUNITY)
Admission: RE | Admit: 2023-12-21 | Discharge: 2023-12-21 | Disposition: A | Discharge status: Home / Self Care | Source: Ambulatory Visit | Attending: Obstetrics | Admitting: Obstetrics

## 2023-12-21 ENCOUNTER — Ambulatory Visit: Attending: Obstetrics and Gynecology

## 2023-12-21 ENCOUNTER — Ambulatory Visit (INDEPENDENT_AMBULATORY_CARE_PROVIDER_SITE_OTHER): Admitting: Obstetrics

## 2023-12-21 ENCOUNTER — Ambulatory Visit (HOSPITAL_BASED_OUTPATIENT_CLINIC_OR_DEPARTMENT_OTHER): Admitting: Obstetrics and Gynecology

## 2023-12-21 VITALS — BP 127/70 | HR 109

## 2023-12-21 VITALS — BP 125/74 | HR 103 | Wt 126.0 lb

## 2023-12-21 DIAGNOSIS — O365930 Maternal care for other known or suspected poor fetal growth, third trimester, not applicable or unspecified: Secondary | ICD-10-CM | POA: Diagnosis present

## 2023-12-21 DIAGNOSIS — Z3A36 36 weeks gestation of pregnancy: Secondary | ICD-10-CM | POA: Insufficient documentation

## 2023-12-21 DIAGNOSIS — Z3685 Encounter for antenatal screening for Streptococcus B: Secondary | ICD-10-CM

## 2023-12-21 DIAGNOSIS — Z113 Encounter for screening for infections with a predominantly sexual mode of transmission: Secondary | ICD-10-CM | POA: Diagnosis present

## 2023-12-21 DIAGNOSIS — O365990 Maternal care for other known or suspected poor fetal growth, unspecified trimester, not applicable or unspecified: Secondary | ICD-10-CM

## 2023-12-21 DIAGNOSIS — O0990 Supervision of high risk pregnancy, unspecified, unspecified trimester: Secondary | ICD-10-CM

## 2023-12-21 DIAGNOSIS — Z363 Encounter for antenatal screening for malformations: Secondary | ICD-10-CM | POA: Diagnosis not present

## 2023-12-21 NOTE — Progress Notes (Addendum)
    Return Prenatal Note   Assessment/Plan   Plan  23 y.o. G1P0 at [redacted]w[redacted]d presents for follow-up OB visit. Reviewed prenatal record including previous visit note.  Fetal growth restriction antepartum Growth US  7/16- EFW 2451 14%, AC 37% MFM recommended no other growth US .    Supervision of high-risk pregnancy - GBS, G/C swabs collected and sent today, will follow up with results via MyChart -Reviewed kick counts and preterm labor warning signs. Instructed to call office or come to hospital with persistent headache, vision changes, regular contractions, leaking of fluid, decreased fetal movement or vaginal bleeding.     No orders of the defined types were placed in this encounter.  No follow-ups on file.   Future Appointments  Date Time Provider Department Center  12/28/2023  1:55 PM Dominic, Jinnie Jansky, CNM [facility] None  01/04/2024  1:55 PM Sebastian Sham, CNM [facility] None  01/11/2024  1:35 PM Janit Alm Agent, MD [facility] None  01/18/2024  1:55 PM Dominic, Jinnie Jansky, CNM [facility] None    For next visit:  Routine prenatal care    Subjective  Kim Hawkins is feeling well overall. She is getting ready for baby; she has a car seat and all other supplies ready for baby's arrival.   Movement: Present Contractions: Not present  Objective   Flow sheet Vitals: Pulse Rate: (!) 103 BP: 125/74 Fundal Height: 34 cm Fetal Heart Rate (bpm): 144 Total weight gain: 12.7 kg  General Appearance  No acute distress, well appearing, and well nourished Pulmonary   Normal work of breathing Neurologic   Alert and oriented to person, place, and time Psychiatric   Mood and affect within normal limits  Kim Hawkins, [facility] 12/21/23 3:54 PM

## 2023-12-21 NOTE — Assessment & Plan Note (Addendum)
 Growth US  7/16- EFW 2451 14%, AC 37% MFM recommended no other growth US .

## 2023-12-21 NOTE — Assessment & Plan Note (Signed)
-   GBS, G/C swabs collected and sent today, will follow up with results via MyChart -Reviewed kick counts and preterm labor warning signs. Instructed to call office or come to hospital with persistent headache, vision changes, regular contractions, leaking of fluid, decreased fetal movement or vaginal bleeding.

## 2023-12-21 NOTE — Progress Notes (Signed)
 Maternal-Fetal Medicine  Name: Kim Hawkins MRN: [id number]  G1 P0 at 36w 1d gestation.  Patient is here for a second opinion.  At ultrasound performed at radiology, [facility] at [redacted] weeks gestation, fetal growth restriction was suspected.  Subsequently, she had ultrasound at [redacted] weeks gestation at your office and no growth restriction was detected.  Her pregnancy is well dated by 11-week ultrasound.  On cell-free fetal DNA screening, the risks of fetal aneuploidies are not increased.  Patient does not have gestational diabetes.  She reports no chronic medical conditions including hypertension.  Patient is 5 feet 2 inches tall and her pregravid BMI was 17.9.  Ultrasound On today's ultrasound, the estimated fetal weight is at the 14 percentile and the abdominal circumference measurement at the 37th percentile.  Head circumference measurement is at between -2 and -1 SD (normal).  Amniotic fluid is normal and good fetal activity seen.  Cephalic presentation.  Fetal anatomical survey appears normal but limited by advanced gestational age.  Suspected fetal growth restriction at previous ultrasound I reassured the couple that there is no evidence of fetal growth restriction at today's ultrasound.  I discussed the limitations of ultrasound and accurately estimating fetal weights.  Patient is likely to be constitutionally small because of small mother.  I reassured the patient that she does not have other risk factors that will contribute to fetal growth restriction.  No early term delivery is indicated patient can wait for spontaneous onset of labor.  Recommendations - No follow-up appointments were made.  Consultation including face-to-face counseling (more than 50% of time spent) is 15 minutes.

## 2023-12-23 ENCOUNTER — Other Ambulatory Visit: Payer: Self-pay

## 2023-12-23 ENCOUNTER — Observation Stay
Admission: EM | Admit: 2023-12-23 | Discharge: 2023-12-23 | Disposition: A | Discharge status: Home / Self Care | Source: Ambulatory Visit | Attending: Certified Nurse Midwife | Admitting: Certified Nurse Midwife

## 2023-12-23 DIAGNOSIS — O0990 Supervision of high risk pregnancy, unspecified, unspecified trimester: Principal | ICD-10-CM

## 2023-12-23 DIAGNOSIS — Z3A36 36 weeks gestation of pregnancy: Secondary | ICD-10-CM | POA: Diagnosis not present

## 2023-12-23 DIAGNOSIS — O479 False labor, unspecified: Secondary | ICD-10-CM

## 2023-12-23 DIAGNOSIS — O4703 False labor before 37 completed weeks of gestation, third trimester: Principal | ICD-10-CM

## 2023-12-23 LAB — CERVICOVAGINAL ANCILLARY ONLY
Chlamydia: NEGATIVE
Comment: NEGATIVE
Comment: NORMAL
Neisseria Gonorrhea: NEGATIVE

## 2023-12-23 NOTE — Progress Notes (Signed)
 Discharge instructions provided to patient. Patient verbalized understanding. Pt educated on signs and symptoms of labor, vaginal bleeding, LOF, and fetal movement. Red flag signs reviewed by RN. Patient discharged home with significant other in stable condition.

## 2023-12-23 NOTE — OB Triage Provider Note (Signed)
   L&D OB Triage Note  SUBJECTIVE DONNA SNOOKS is a 23 y.o. G1P0 female at [redacted]w[redacted]d, EDD Estimated Date of Delivery: 01/17/24 who presented to triage with complaints of contractions. She denies loss of fluid, vaginal bleeding, and is feeling good fetal movement .   OB History  Gravida Para Term Preterm AB Living  1 0 0 0 0 0  SAB IAB Ectopic Multiple Live Births  0 0 0 0 0    # Outcome Date GA Lbr Len/2nd Weight Sex Type Anes PTL Lv  1 Current             Medications Prior to Admission  Medication Sig Dispense Refill Last Dose/Taking   albuterol  (VENTOLIN  HFA) 108 (90 Base) MCG/ACT inhaler Inhale 1 puff into the lungs every 6 (six) hours as needed for wheezing or shortness of breath. 1 each 1 Past Month   Prenatal Vit-Fe Fumarate-FA (MULTIVITAMIN-PRENATAL) 27-0.8 MG TABS tablet Take 1 tablet by mouth daily at 12 noon.   12/23/2023     OBJECTIVE  Nursing Evaluation:   Ht 5' 4 (1.626 m)   Wt 57.2 kg   LMP 03/13/2023 (Exact Date)   BMI 21.63 kg/m    Findings:  irregular mild contractions.       NST was performed and has been reviewed by me.  NST INTERPRETATION: Category I  Mode: External Baseline Rate (A): 145 bpm Variability: Moderate Accelerations: 15 x 15 Decelerations: None     Contraction Frequency (min): ctx x 2  ASSESSMENT Impression:  1.  Pregnancy:  G1P0 at [redacted]w[redacted]d , EDD Estimated Date of Delivery: 01/17/24 2.  Reassuring fetal and maternal status 3.  Irregular contractions, false labor  PLAN 1. Current condition and above findings reviewed.  Reassuring fetal and maternal condition. 2. Discharge home with standard labor precautions given to return to L&D or call the office for problems. 3. Continue routine prenatal care.    I was present and evaluated this patient in person.   Zelda Hummer, CNM

## 2023-12-23 NOTE — OB Triage Note (Signed)
 Pt states ctx stared today at 1251. Denies vaginal bleeding, LOF. Marijo Merribeth RAMAN

## 2023-12-25 ENCOUNTER — Observation Stay
Admission: EM | Admit: 2023-12-25 | Discharge: 2023-12-28 | DRG: 807 | Disposition: A | Discharge status: Home / Self Care | Attending: Obstetrics and Gynecology | Admitting: Family

## 2023-12-25 ENCOUNTER — Encounter: Payer: Self-pay | Admitting: Family

## 2023-12-25 ENCOUNTER — Other Ambulatory Visit: Payer: Self-pay

## 2023-12-25 DIAGNOSIS — O0990 Supervision of high risk pregnancy, unspecified, unspecified trimester: Principal | ICD-10-CM

## 2023-12-25 DIAGNOSIS — O4292 Full-term premature rupture of membranes, unspecified as to length of time between rupture and onset of labor: Principal | ICD-10-CM | POA: Diagnosis present

## 2023-12-25 DIAGNOSIS — J4540 Moderate persistent asthma, uncomplicated: Secondary | ICD-10-CM | POA: Diagnosis present

## 2023-12-25 DIAGNOSIS — Z880 Allergy status to penicillin: Secondary | ICD-10-CM

## 2023-12-25 DIAGNOSIS — O4290 Premature rupture of membranes, unspecified as to length of time between rupture and onset of labor, unspecified weeks of gestation: Secondary | ICD-10-CM | POA: Diagnosis present

## 2023-12-25 DIAGNOSIS — Z91040 Latex allergy status: Secondary | ICD-10-CM

## 2023-12-25 DIAGNOSIS — Z3A36 36 weeks gestation of pregnancy: Secondary | ICD-10-CM

## 2023-12-25 DIAGNOSIS — O479 False labor, unspecified: Secondary | ICD-10-CM | POA: Diagnosis present

## 2023-12-25 DIAGNOSIS — O9952 Diseases of the respiratory system complicating childbirth: Secondary | ICD-10-CM | POA: Diagnosis present

## 2023-12-25 LAB — STREP GP B CULTURE+RFLX: Strep Gp B Culture+Rflx: NEGATIVE

## 2023-12-25 LAB — RUPTURE OF MEMBRANE (ROM)PLUS: Rom Plus: NEGATIVE

## 2023-12-25 NOTE — OB Triage Note (Signed)
 Patient is a 23 yo, G1P0, at 36 weeks and 5 days. Patient presents with complaints of contractions and leaking of fluid. Pt reports her water breaking and contractions starting around 1915. Patient denies any vaginal bleeding. Patient reports +FM. Monitors applied and assessing. Initial fetal heart tone is 120. M. Cowen CNM notified of patients arrival to unit. Plan to do rom+

## 2023-12-25 NOTE — Final Consult Note (Incomplete)
 OB/Triage Note  Patient ID: Kim Hawkins MRN: [id number] DOB/AGE: 12-31-2000 23 y.o.  Subjective  History of Present Illness: The patient is a 23 y.o. female G1P0 at [redacted]w[redacted]d by 11wk u/s who presents for LOF and contractions. Reports small amount of leaking of fluid and contractions both starting at 1915 this evening. Reports contractions are about three minutes apart. Denies VB, HA, visual changes, endorses good FM.  Pregnancy has been complicated by fetal growth restriction which resolved, mild intermittent asthma, ADHD, pregravid BMI 16.81 and varicella non immune status.  Past Medical History:  Diagnosis Date   Asthma     Past Surgical History:  Procedure Laterality Date   NO PAST SURGERIES      No current facility-administered medications on file prior to encounter.   Current Outpatient Medications on File Prior to Encounter  Medication Sig Dispense Refill   albuterol  (VENTOLIN  HFA) 108 (90 Base) MCG/ACT inhaler Inhale 1 puff into the lungs every 6 (six) hours as needed for wheezing or shortness of breath. 1 each 1   Prenatal Vit-Fe Fumarate-FA (MULTIVITAMIN-PRENATAL) 27-0.8 MG TABS tablet Take 1 tablet by mouth daily at 12 noon.      Allergies  Allergen Reactions   Coconut (Cocos Nucifera) Hives   Penicillins Hives   Cocos Nucifera Hives   Latex Dermatitis    Social History   Socioeconomic History   Marital status: Significant Other    Spouse name: Not on file   Number of children: 0   Years of education: 10   Highest education level: 9th grade  Occupational History   Not on file  Tobacco Use   Smoking status: Never    Passive exposure: Never   Smokeless tobacco: Never  Vaping Use   Vaping status: Some Days   Substances: Nicotine  Substance and Sexual Activity   Alcohol use: No   Drug use: No   Sexual activity: Yes    Partners: Male    Birth control/protection: Other-see comments    Comment: Pregnant  Other Topics Concern   Not on file  Social  History Narrative   Not on file   Social Drivers of Health   Financial Resource Strain: Not on file  Food Insecurity: No Food Insecurity (06/27/2023)   Hunger Vital Sign    Worried About Running Out of Food in the Last Year: Never true    Ran Out of Food in the Last Year: Never true  Transportation Needs: No Transportation Needs (06/27/2023)   PRAPARE - Administrator, Civil Service (Medical): No    Lack of Transportation (Non-Medical): No  Physical Activity: Not on file  Stress: No Stress Concern Present (06/27/2023)   [employer] of Occupational Health - Occupational Stress Questionnaire    Feeling of Stress : Not at all  Social Connections: Unknown (05/04/2022)   Received from [facility]   Social Network    Social Network: Not on file  Intimate Partner Violence: Not At Risk (06/27/2023)   Humiliation, Afraid, Rape, and Kick questionnaire    Fear of Current or Ex-Partner: No    Emotionally Abused: No    Physically Abused: No    Sexually Abused: No    Family History  Problem Relation Age of Onset   Ulcers Mother    Heart failure Sister    Breast cancer Neg Hx    Cervical cancer Neg Hx    Colon cancer Neg Hx      ROS    Objective  Physical Exam: BP 127/68   Pulse 91   LMP 03/13/2023 (Exact Date)   OBGyn Exam  FHT Toco  Significant Findings/ Diagnostic Studies: {diagnostics:18242}   Hospital Course: The patient was admitted to [facility] Triage for observation. ***  Assessment: 23 y.o. female G1P0 at [redacted]w[redacted]d   Plan: Discharge home Follow up teaching   Allergies as of 12/25/2023       Reactions   Coconut (cocos Nucifera) Hives   Penicillins Hives   Cocos Nucifera Hives   Latex Dermatitis     Med Rec must be completed prior to using this SMARTLINK***        Total time spent taking care of this patient: *** minutes  Signed: Lolita Loots CNM, FNP 12/25/2023, 10:17 PM

## 2023-12-26 DIAGNOSIS — O26893 Other specified pregnancy related conditions, third trimester: Secondary | ICD-10-CM | POA: Diagnosis present

## 2023-12-26 DIAGNOSIS — O42013 Preterm premature rupture of membranes, onset of labor within 24 hours of rupture, third trimester: Secondary | ICD-10-CM

## 2023-12-26 DIAGNOSIS — Z3A37 37 weeks gestation of pregnancy: Secondary | ICD-10-CM | POA: Diagnosis not present

## 2023-12-26 DIAGNOSIS — O9952 Diseases of the respiratory system complicating childbirth: Secondary | ICD-10-CM | POA: Diagnosis present

## 2023-12-26 DIAGNOSIS — O479 False labor, unspecified: Secondary | ICD-10-CM | POA: Diagnosis present

## 2023-12-26 DIAGNOSIS — O4202 Full-term premature rupture of membranes, onset of labor within 24 hours of rupture: Secondary | ICD-10-CM | POA: Diagnosis not present

## 2023-12-26 DIAGNOSIS — Z91040 Latex allergy status: Secondary | ICD-10-CM | POA: Diagnosis not present

## 2023-12-26 DIAGNOSIS — Z3A36 36 weeks gestation of pregnancy: Secondary | ICD-10-CM | POA: Diagnosis not present

## 2023-12-26 DIAGNOSIS — Z880 Allergy status to penicillin: Secondary | ICD-10-CM | POA: Diagnosis not present

## 2023-12-26 DIAGNOSIS — J4540 Moderate persistent asthma, uncomplicated: Secondary | ICD-10-CM | POA: Diagnosis present

## 2023-12-26 DIAGNOSIS — O4292 Full-term premature rupture of membranes, unspecified as to length of time between rupture and onset of labor: Secondary | ICD-10-CM | POA: Diagnosis present

## 2023-12-26 LAB — CBC
HCT: 31.6 % — ABNORMAL LOW (ref 36.0–46.0)
Hemoglobin: 10.9 g/dL — ABNORMAL LOW (ref 12.0–15.0)
MCH: 30.4 pg (ref 26.0–34.0)
MCHC: 34.5 g/dL (ref 30.0–36.0)
MCV: 88 fL (ref 80.0–100.0)
Platelets: 323 K/uL (ref 150–400)
RBC: 3.59 MIL/uL — ABNORMAL LOW (ref 3.87–5.11)
RDW: 13 % (ref 11.5–15.5)
WBC: 16.5 K/uL — ABNORMAL HIGH (ref 4.0–10.5)
nRBC: 0 % (ref 0.0–0.2)

## 2023-12-26 LAB — TYPE AND SCREEN
ABO/RH(D): A POS
Antibody Screen: NEGATIVE

## 2023-12-26 MED ORDER — ONDANSETRON 4 MG PO TBDP
4.0000 mg | ORAL_TABLET | Freq: Four times a day (QID) | ORAL | Status: DC | PRN
  Administered 2023-12-26: 4 mg via ORAL
  Filled 2023-12-26: qty 1

## 2023-12-26 MED ORDER — OXYTOCIN-SODIUM CHLORIDE 30-0.9 UT/500ML-% IV SOLN: 2.5000 [IU]/h | INTRAVENOUS | Status: DC

## 2023-12-26 MED ORDER — OXYTOCIN 10 UNIT/ML IJ SOLN
INTRAMUSCULAR | Status: DC
Start: 2023-12-26 — End: 2023-12-26
  Filled 2023-12-26: qty 2

## 2023-12-26 MED ORDER — LIDOCAINE HCL (PF) 1 % IJ SOLN: 30.0000 mL | INTRAMUSCULAR | Status: DC | PRN

## 2023-12-26 MED ORDER — MISOPROSTOL 200 MCG PO TABS
ORAL_TABLET | ORAL | Status: AC
  Filled 2023-12-26: qty 4

## 2023-12-26 MED ORDER — LACTATED RINGERS IV SOLN: 500.0000 mL | INTRAVENOUS | Status: DC | PRN

## 2023-12-26 MED ORDER — AMMONIA AROMATIC IN INHA
RESPIRATORY_TRACT | Status: AC
  Filled 2023-12-26: qty 10

## 2023-12-26 MED ORDER — SOD CITRATE-CITRIC ACID 500-334 MG/5ML PO SOLN: 30.0000 mL | ORAL | Status: DC | PRN

## 2023-12-26 MED ORDER — LACTATED RINGERS IV SOLN: INTRAVENOUS | Status: AC

## 2023-12-26 MED ORDER — TERBUTALINE SULFATE 1 MG/ML IJ SOLN: 0.2500 mg | Freq: Once | INTRAMUSCULAR | Status: DC | PRN

## 2023-12-26 MED ORDER — LIDOCAINE HCL (PF) 1 % IJ SOLN
INTRAMUSCULAR | Status: AC
  Filled 2023-12-26: qty 30

## 2023-12-26 MED ORDER — FENTANYL CITRATE (PF) 100 MCG/2ML IJ SOLN
50.0000 ug | INTRAMUSCULAR | Status: DC | PRN
  Administered 2023-12-26 (×3): 100 ug via INTRAVENOUS
  Filled 2023-12-26 (×3): qty 2

## 2023-12-26 MED ORDER — OXYTOCIN-SODIUM CHLORIDE 30-0.9 UT/500ML-% IV SOLN
INTRAVENOUS | Status: AC
  Administered 2023-12-26: 4 m[IU]/min via INTRAVENOUS
  Filled 2023-12-26: qty 500

## 2023-12-26 MED ORDER — HYDROXYZINE HCL 25 MG PO TABS: 50.0000 mg | ORAL_TABLET | Freq: Four times a day (QID) | ORAL | Status: DC | PRN

## 2023-12-26 MED ORDER — ACETAMINOPHEN 325 MG PO TABS
650.0000 mg | ORAL_TABLET | ORAL | Status: DC | PRN
Start: 2023-12-26 — End: 2023-12-27
  Administered 2023-12-26: 650 mg via ORAL
  Filled 2023-12-26: qty 2

## 2023-12-26 MED ORDER — OXYTOCIN BOLUS FROM INFUSION
333.0000 mL | Freq: Once | INTRAVENOUS | Status: AC
  Administered 2023-12-27: 333 mL via INTRAVENOUS

## 2023-12-26 MED ORDER — OXYTOCIN-SODIUM CHLORIDE 30-0.9 UT/500ML-% IV SOLN: 1.0000 m[IU]/min | INTRAVENOUS | Status: DC

## 2023-12-26 MED ORDER — ONDANSETRON HCL 4 MG/2ML IJ SOLN: 4.0000 mg | Freq: Four times a day (QID) | INTRAMUSCULAR | Status: DC | PRN

## 2023-12-26 NOTE — H&P (Signed)
 History and Physical   HPI  The patient is a 23 y.o. female G1P0 at [redacted]w[redacted]d by 11wk u/s who presents for LOF and contractions. Reports small amount of leaking of fluid and contractions both starting at 1915 this evening. Reports contractions are about three minutes apart. Denies VB, HA, visual changes, endorses good FM. ROM plus was negative but after its collection at 2229 she SROM'd for a large amount of clear fluid. Reports pain 10/10 with contractions.   Pregnancy has been complicated by fetal growth restriction which resolved, mild intermittent asthma, ADHD, pregravid BMI 16.81 and varicella non immune status. Patient has a needle phobia. Due to this she is declining IV and admission labs. Had discussion about potential need for labs or IV down the road, she is open to them if she is having an emergent situation such as heavy bleeding but for now declines. This CNM did strongly encourage admit labs to be completed, patient did decline.    OB History  OB History  Gravida Para Term Preterm AB Living  1 0 0 0 0 0  SAB IAB Ectopic Multiple Live Births  0 0 0 0 0    # Outcome Date GA Lbr Len/2nd Weight Sex Type Anes PTL Lv  1 Current             PROBLEM LIST  Pregnancy complications or risks: Patient Active Problem List   Diagnosis Date Noted   Uterine contractions 12/26/2023   Leakage of amniotic fluid 12/25/2023   Labor and delivery, indication for care 12/23/2023   Fetal growth restriction antepartum 10/27/2023   Supervision of high-risk pregnancy 06/27/2023   ADHD (attention deficit hyperactivity disorder) 01/10/2022   Mild intermittent asthma, uncomplicated 03/21/2017   Moderate persistent asthma with acute exacerbation 03/21/2017    Prenatal labs and studies: ABO, Rh: A/Positive/-- (03/05 9188) Antibody: Negative (03/05 0811) Rubella: 4.20 (03/05 0811) RPR: Non Reactive (05/22 1004)  HBsAg: Negative (03/05 0811)  HIV: Non Reactive (05/22 1004)  HAD:Wzhjupcz/--  (07/16 1616) Varicella: non immune GC/CT: neg/neg 1hr GTT: 132 H&H:  Hemoglobin  Date Value Ref Range Status  10/27/2023 11.5 11.1 - 15.9 g/dL Final  96/94/7974 88.1 11.1 - 15.9 g/dL Final  87/69/7975 88.5 (L) 12.0 - 15.0 g/dL Final  96/70/7976 84.5 (H) 12.0 - 15.0 g/dL Final  93/75/7977 85.5 12.0 - 15.0 g/dL Final  97/90/7978 83.7 (H) 12.0 - 16.0 g/dL Final      Past Medical History:  Diagnosis Date   Asthma      Past Surgical History:  Procedure Laterality Date   NO PAST SURGERIES       Medications    Current Discharge Medication List     CONTINUE these medications which have NOT CHANGED   Details  Prenatal Vit-Fe Fumarate-FA (MULTIVITAMIN-PRENATAL) 27-0.8 MG TABS tablet Take 1 tablet by mouth daily at 12 noon.    albuterol  (VENTOLIN  HFA) 108 (90 Base) MCG/ACT inhaler Inhale 1 puff into the lungs every 6 (six) hours as needed for wheezing or shortness of breath. Qty: 1 each, Refills: 1   Associated Diagnoses: Moderate persistent asthma with acute exacerbation         Allergies  Coconut (cocos nucifera), Penicillins, Cocos nucifera, and Latex  Review of Systems  Pertinent items are noted in HPI.  Physical Exam  BP 127/68 (BP Location: Left Arm)   Pulse 91   Temp 98.7 F (37.1 C) (Oral)   Resp 16   LMP 03/13/2023 (Exact Date)   Lungs:  CTA B Cardio: S1S2, RRR Abd: Soft, gravid, NT Presentation: cephalic DTRs: 2+ B SVE: 3cm/50%/-2 Grossly srom'd for clear fluid  FHR 130, mod variability, pos accels, no decels Toco Ucsq2-71min   Test Results  Results for orders placed or performed during the hospital encounter of 12/25/23 (from the past 24 hours)  Rupture of Membrane (ROM) Plus     Status: None   Collection Time: 12/25/23 10:23 PM  Result Value Ref Range   Rom Plus NEGATIVE    Group B Strep negative  Assessment  G1P0 at [redacted]w[redacted]d Estimated Date of Delivery: 01/17/24  RNST Early labor SROM'd-afebrile GBS neg  Patient Active Problem  List   Diagnosis Date Noted   Uterine contractions 12/26/2023   Leakage of amniotic fluid 12/25/2023   Labor and delivery, indication for care 12/23/2023   Fetal growth restriction antepartum 10/27/2023   Supervision of high-risk pregnancy 06/27/2023   ADHD (attention deficit hyperactivity disorder) 01/10/2022   Mild intermittent asthma, uncomplicated 03/21/2017   Moderate persistent asthma with acute exacerbation 03/21/2017    Plan  1. Admit to L&D   2. EFM per unit policy 3. Labs : T&S, CBC, RPR- declined 4. Dr. Janit notified of patient admission and status  5. Expectant management 6. Plans unmedicated birth 7. Declines IV currently  Lolita Loots, CNM, FNP 12/26/2023 12:15 AM

## 2023-12-26 NOTE — Progress Notes (Signed)
 LABOR NOTE   Kim Hawkins 23 y.o.GP@ at [redacted]w[redacted]d  SUBJECTIVE:  States she is feeling more back pain  Analgesia: Labor support without medications  OBJECTIVE:  BP 134/73   Pulse 97   Temp 98 F (36.7 C) (Oral)   Resp 18   Ht 5' 4 (1.626 m)   Wt 57 kg   LMP 03/13/2023 (Exact Date)   BMI 21.57 kg/m  Total I/O In: 240 [P.O.:240] Out: -   She has not shown cervical change. CERVIX: 4-5cm:  70%:   -2:   mid position:   firm SVE:   Dilation: 5 Effacement (%): 60 Station: -2 Exam by:: A. athompson, CNM CONTRACTIONS: irregular, every 4-8 minutes FHR: Fetal heart tracing reviewed. Baseline: 130 bpm, Variability: Good {> 6 bpm), Accelerations: Reactive, and Decelerations: Absent Category I    Labs: Lab Results  Component Value Date   WBC 12.2 (H) 10/27/2023   HGB 11.5 10/27/2023   HCT 35.8 10/27/2023   MCV 98 (H) 10/27/2023   PLT 298 10/27/2023    ASSESSMENT: 1) Labor curve reviewed.       Progress: Not in labor.     Membranes: ruptured            Principal Problem:   Leakage of amniotic fluid Active Problems:   Uterine contractions PROM  PLAN: Pt mother is at the bedside she is now agreeing to IV with Pitocin  to augment her labor. Discussed use of cooks catheter , she declines catheter use.   Zelda Hummer, CNM  12/26/2023 6:21 PM

## 2023-12-26 NOTE — Progress Notes (Signed)
 LABOR NOTE   Kim Hawkins 23 y.o.GP@ at [redacted]w[redacted]d  SUBJECTIVE:  Feeling like some of the contractions are stronger. Analgesia: Labor support without medications  OBJECTIVE:  BP 116/77   Pulse (!) 113   Temp 98.5 F (36.9 C) (Oral)   Resp 18   Ht 5' 4 (1.626 m)   Wt 57 kg   LMP 03/13/2023 (Exact Date)   BMI 21.57 kg/m  No intake/output data recorded.  She has not shown cervical change. CERVIX: 4-5 cm:  70%:   -2:   anterior:   firm SVE:   Dilation: 5 Effacement (%): 60 Station: -2 Exam by:: A. Hawkins, CNM CONTRACTIONS: irregular, every 2-10 minutes FHR: Fetal heart tracing reviewed. Baseline: 125 bpm, Variability: Good {> 6 bpm), Accelerations: Reactive, and Decelerations: Absent Category I    Labs: Lab Results  Component Value Date   WBC 12.2 (H) 10/27/2023   HGB 11.5 10/27/2023   HCT 35.8 10/27/2023   MCV 98 (H) 10/27/2023   PLT 298 10/27/2023    ASSESSMENT: 1) Labor curve reviewed.       Progress: Prolonged latent labor.     Membranes: ruptured            Principal Problem:   Leakage of amniotic fluid Active Problems:   Uterine contractions   PLAN: Discussed prolonged rupture of membranes and risk of chorioamnionitis. Recommend augmentation. Pt declines , Dr Janit aware .   Kim Hawkins, CNM  12/26/2023 1:37 PM

## 2023-12-26 NOTE — Progress Notes (Signed)
 LABOR NOTE   Kim Hawkins 23 y.o.GP@ at [redacted]w[redacted]d  SUBJECTIVE:  Pt feeling more discomfort with contractions Analgesia: IV pain meds  OBJECTIVE:  BP 127/64 (BP Location: Right Arm)   Pulse 89   Temp 98.3 F (36.8 C) (Oral)   Resp 16   Ht 5' 4 (1.626 m)   Wt 57 kg   LMP 03/13/2023 (Exact Date)   BMI 21.57 kg/m  No intake/output data recorded.  She has shown cervical change. CERVIX: 5 cm:  70%:   -2:   posterior:   firm SVE:   Dilation: 5 Effacement (%): 70 Station: -2 Exam by:: Sebastian, CNM CONTRACTIONS: regular, every 2-3 minutes FHR: Fetal heart tracing reviewed. Baseline: 130 bpm, Variability: Good {> 6 bpm), Accelerations: Non-reactive but appropriate for gestational age, and Decelerations: Absent Category I    Labs: Lab Results  Component Value Date   WBC 16.5 (H) 12/26/2023   HGB 10.9 (L) 12/26/2023   HCT 31.6 (L) 12/26/2023   MCV 88.0 12/26/2023   PLT 323 12/26/2023    ASSESSMENT: 1) Labor curve reviewed.       Progress: Early latent labor.     Membranes: ruptured            Principal Problem:   Leakage of amniotic fluid Active Problems:   Uterine contractions PROM  PLAN: continue present management. Dr. Janit notified of pt progress.   Zelda Sebastian, CNM  12/26/2023 9:28 PM

## 2023-12-26 NOTE — Progress Notes (Signed)
  Labor Progress Note   23 y.o. G1P0 @ [redacted]w[redacted]d   Subjective:  Patient reports pelvic pressure and pain 10/10 with contractions  Objective:  BP 121/67   Pulse 94   Temp 98.5 F (36.9 C) (Oral)   Resp 18   Ht 5' 4 (1.626 m)   Wt 57 kg   LMP 03/13/2023 (Exact Date)   BMI 21.57 kg/m  Abd: gravid SVE: 4cm/50%/-2, vertex  EFM: 130, mod variability, pos accels, no decels Toco: Ucs q1-37min   Assessment  G1P0 @ 37wk0d Early labor VSS FHR Cat I SROM 4 hours- afebrile  Plan:   1. Expectant management- has made cervical change since last visit 2. Plan repeat SVE in four hours or sooner as needed  All discussed with patient  Lolita Loots CNM, FNP [facility] 12/26/2023  4:09 AM

## 2023-12-26 NOTE — Progress Notes (Signed)
  Labor Progress Note   23 y.o. G1P0 @ [redacted]w[redacted]d   Subjective:  Reports pain 10/10 with contractions  Objective:  BP 115/68   Pulse 92   Temp 98.2 F (36.8 C) (Oral)   Resp 18   Ht 5' 4 (1.626 m)   Wt 57 kg   LMP 03/13/2023 (Exact Date)   BMI 21.57 kg/m  Abd: gravid SVE: 5cm/50%/-2, vertex  EFM: 130, mod variability, pos accels, no decels Toco: Ucs q2-110min   Assessment  G1P0 @ [redacted]w[redacted]d Early labor VSS FHR Cat I SROM 10hr- afebrile   Plan:   1. Expectant management  All discussed with patient  Lolita Loots CNM, FNP [facility] 12/26/2023  7:51 AM

## 2023-12-26 NOTE — Progress Notes (Addendum)
 Pt refused all labs and IV interventions. Pt strongly encouraged to get labs and IV. Pt informed and educated on IV interventions that could take place but still refused.

## 2023-12-27 ENCOUNTER — Encounter: Payer: Self-pay | Admitting: Family

## 2023-12-27 DIAGNOSIS — Z3A37 37 weeks gestation of pregnancy: Secondary | ICD-10-CM

## 2023-12-27 DIAGNOSIS — O4202 Full-term premature rupture of membranes, onset of labor within 24 hours of rupture: Secondary | ICD-10-CM

## 2023-12-27 LAB — CBC
HCT: 28.3 % — ABNORMAL LOW (ref 36.0–46.0)
Hemoglobin: 9.5 g/dL — ABNORMAL LOW (ref 12.0–15.0)
MCH: 30.1 pg (ref 26.0–34.0)
MCHC: 33.6 g/dL (ref 30.0–36.0)
MCV: 89.6 fL (ref 80.0–100.0)
Platelets: 255 K/uL (ref 150–400)
RBC: 3.16 MIL/uL — ABNORMAL LOW (ref 3.87–5.11)
RDW: 13 % (ref 11.5–15.5)
WBC: 14.7 K/uL — ABNORMAL HIGH (ref 4.0–10.5)
nRBC: 0 % (ref 0.0–0.2)

## 2023-12-27 LAB — RPR: RPR Ser Ql: NONREACTIVE

## 2023-12-27 LAB — HIV ANTIBODY (ROUTINE TESTING W REFLEX): HIV Screen 4th Generation wRfx: NONREACTIVE

## 2023-12-27 MED ORDER — ACETAMINOPHEN 325 MG PO TABS
650.0000 mg | ORAL_TABLET | ORAL | Status: DC | PRN
  Administered 2023-12-27 (×3): 650 mg via ORAL
  Filled 2023-12-27 (×3): qty 2

## 2023-12-27 MED ORDER — OXYCODONE HCL 5 MG PO TABS: 10.0000 mg | ORAL_TABLET | ORAL | Status: DC | PRN

## 2023-12-27 MED ORDER — FERROUS SULFATE 325 (65 FE) MG PO TABS
325.0000 mg | ORAL_TABLET | Freq: Every day | ORAL | Status: DC
Start: 2023-12-27 — End: 2023-12-28
  Administered 2023-12-27 – 2023-12-28 (×2): 325 mg via ORAL
  Filled 2023-12-27 (×2): qty 1

## 2023-12-27 MED ORDER — SIMETHICONE 80 MG PO CHEW: 80.0000 mg | CHEWABLE_TABLET | ORAL | Status: DC | PRN

## 2023-12-27 MED ORDER — DOCUSATE SODIUM 100 MG PO CAPS
100.0000 mg | ORAL_CAPSULE | Freq: Two times a day (BID) | ORAL | Status: DC
  Administered 2023-12-28: 100 mg via ORAL
  Filled 2023-12-27: qty 1

## 2023-12-27 MED ORDER — SENNOSIDES-DOCUSATE SODIUM 8.6-50 MG PO TABS
2.0000 | ORAL_TABLET | Freq: Every day | ORAL | Status: DC
  Filled 2023-12-27 (×2): qty 2

## 2023-12-27 MED ORDER — METHYLERGONOVINE MALEATE 0.2 MG/ML IJ SOLN: 0.2000 mg | INTRAMUSCULAR | Status: DC | PRN

## 2023-12-27 MED ORDER — DIBUCAINE (PERIANAL) 1 % EX OINT
1.0000 | TOPICAL_OINTMENT | CUTANEOUS | Status: DC | PRN
  Administered 2023-12-27: 1 via RECTAL
  Filled 2023-12-27: qty 28

## 2023-12-27 MED ORDER — ONDANSETRON HCL 4 MG/2ML IJ SOLN: 4.0000 mg | INTRAMUSCULAR | Status: DC | PRN

## 2023-12-27 MED ORDER — WITCH HAZEL-GLYCERIN EX PADS
1.0000 | MEDICATED_PAD | CUTANEOUS | Status: DC | PRN
  Administered 2023-12-27: 1 via TOPICAL
  Filled 2023-12-27 (×3): qty 100

## 2023-12-27 MED ORDER — BENZOCAINE-MENTHOL 20-0.5 % EX AERO
1.0000 | INHALATION_SPRAY | CUTANEOUS | Status: DC | PRN
  Administered 2023-12-27: 1 via TOPICAL
  Filled 2023-12-27: qty 56

## 2023-12-27 MED ORDER — IBUPROFEN 600 MG PO TABS
600.0000 mg | ORAL_TABLET | Freq: Four times a day (QID) | ORAL | Status: DC
Start: 2023-12-27 — End: 2023-12-28
  Administered 2023-12-27 (×4): 600 mg via ORAL
  Filled 2023-12-27 (×5): qty 1

## 2023-12-27 MED ORDER — OXYCODONE HCL 5 MG PO TABS: 5.0000 mg | ORAL_TABLET | ORAL | Status: DC | PRN

## 2023-12-27 MED ORDER — PRENATAL MULTIVITAMIN CH
1.0000 | ORAL_TABLET | Freq: Every day | ORAL | Status: DC
Start: 2023-12-27 — End: 2023-12-28
  Administered 2023-12-27: 1 via ORAL
  Filled 2023-12-27 (×2): qty 1

## 2023-12-27 MED ORDER — VARICELLA VIRUS VACCINE LIVE 1350 PFU/0.5ML IJ SUSR
0.5000 mL | Freq: Once | INTRAMUSCULAR | Status: DC
  Filled 2023-12-27: qty 0.5

## 2023-12-27 MED ORDER — TETANUS-DIPHTH-ACELL PERTUSSIS 5-2.5-18.5 LF-MCG/0.5 IM SUSY: 0.5000 mL | PREFILLED_SYRINGE | Freq: Once | INTRAMUSCULAR | Status: DC

## 2023-12-27 MED ORDER — METHYLERGONOVINE MALEATE 0.2 MG PO TABS: 0.2000 mg | ORAL_TABLET | ORAL | Status: DC | PRN

## 2023-12-27 NOTE — Discharge Summary (Signed)
 Postpartum Discharge Summary  Date of Service updated***     Patient Name: Kim Hawkins DOB: 03/21/2001 MRN: [id number]  Date of admission: 12/25/2023 Delivery date:12/27/2023 Delivering provider: SEBASTIAN SHAM Date of discharge: 12/27/2023  Admitting diagnosis: Leakage of amniotic fluid [O42.90] Uterine contractions [O47.9] Intrauterine pregnancy: [redacted]w[redacted]d     Secondary diagnosis:  Principal Problem:   Leakage of amniotic fluid Active Problems:   Uterine contractions  Additional problems: PROM    Discharge diagnosis: Term Pregnancy Delivered                                              Post partum procedures:{Postpartum procedures:23558} Augmentation: Pitocin  Complications: None and ROM>24 hours  Hospital course: PROM With Vaginal Delivery      23 y.o. yo G1P0 at [redacted]w[redacted]d was admitted for PROM on 12/25/2023. Labor course was uncomplicated.  Membrane Rupture Time/Date: 10:29 PM,12/25/2023  Delivery Method:Vaginal, Spontaneous Operative Delivery:N/A Episiotomy: None Lacerations:  1st degree;Vaginal Patient had a postpartum course complicated by ***.  She is ambulating, tolerating a regular diet, passing flatus, and urinating well. Patient is discharged home in stable condition on 12/27/23.  Newborn Data: Birth date:12/27/2023 Birth time:12:11 AM Gender:Female Living status:  Apgars:8 ,9  Weight:   Magnesium Sulfate received: No BMZ received: No Rhophylac:N/A MMR:N/A T-DaP:pt declined Flu: N/A RSV Vaccine received: No Transfusion:No Immunizations administered:  There is no immunization history on file for this patient.  Physical exam  Vitals:   12/26/23 1714 12/26/23 1918 12/26/23 1944 12/26/23 2230  BP: 134/73 (!) 140/72 127/64   Pulse: 97 (!) 103 89   Resp:  16    Temp: 98 F (36.7 C) 98.3 F (36.8 C)  97.7 F (36.5 C)  TempSrc: Oral Oral  Oral  Weight:      Height:       General: {Exam; general:21111117} Lochia: {Desc;  appropriate/inappropriate:30686::appropriate} Uterine Fundus: {Desc; firm/soft:30687} Incision: {Exam; incision:21111123} DVT Evaluation: {Exam; dvt:2111122} Labs: Lab Results  Component Value Date   WBC 16.5 (H) 12/26/2023   HGB 10.9 (L) 12/26/2023   HCT 31.6 (L) 12/26/2023   MCV 88.0 12/26/2023   PLT 323 12/26/2023      Latest Ref Rng & Units 06/06/2023    1:30 AM  CMP  Glucose 70 - 99 mg/dL 885   BUN 6 - 20 mg/dL 9   Creatinine 9.55 - 8.99 mg/dL 9.56   Sodium 864 - 854 mmol/L 131   Potassium 3.5 - 5.1 mmol/L 3.6   Chloride 98 - 111 mmol/L 102   CO2 22 - 32 mmol/L 19   Calcium 8.9 - 10.3 mg/dL 8.7   Total Protein 6.5 - 8.1 g/dL 7.4   Total Bilirubin <8.7 mg/dL 0.6   Alkaline Phos 38 - 126 U/L 39   AST 15 - 41 U/L 15   ALT 0 - 44 U/L 16    Edinburgh Score:     No data to display            After visit meds:  Allergies as of 12/27/2023       Reactions   Coconut (cocos Nucifera) Hives   Penicillins Hives   Cocos Nucifera Hives   Latex Dermatitis     Med Rec must be completed prior to using this [facility]***        Discharge home in stable condition Infant Feeding: Bottle Infant  Disposition:{CHL IP OB HOME WITH FNUYZM:76418} Discharge instruction: per After Visit Summary and Postpartum booklet. Activity: Advance as tolerated. Pelvic rest for 6 weeks.  Diet: routine diet Anticipated Birth Control: {Birth Control:23956} Postpartum Appointment:{Outpatient follow up:23559} Additional Postpartum F/U: {PP Procedure:23957} Future Appointments: Future Appointments  Date Time Provider Department Center  12/28/2023  1:55 PM Dominic, Jinnie Jansky, CNM [facility] None  01/04/2024  1:55 PM Sebastian Sham, CNM [facility] None  01/11/2024  1:35 PM Janit Alm Agent, MD [facility] None  01/18/2024  1:55 PM Dominic, Jinnie Jansky, CNM [facility] None   Follow up Visit:      12/27/2023 Sham Sebastian, CNM

## 2023-12-27 NOTE — Progress Notes (Signed)
 Post Partum Day 0 Subjective: Kim Hawkins is feeling well overall. She is ambulating, voiding, and tolerating POs without difficulty. Her pain is well-controlled and her bleeding is WNL. Her mood is stable. She is bottle feeding.  Objective: Blood pressure 122/68, pulse 82, temperature 97.8 F (36.6 C), temperature source Oral, resp. rate 18, height 5' 4 (1.626 m), weight 57 kg, last menstrual period 03/13/2023, SpO2 99%, unknown if currently breastfeeding.  Physical Exam:  General: alert, cooperative, and appears stated age Lochia: appropriate Uterine Fundus: firm Perineum: healing well DVT Evaluation: No evidence of DVT seen on physical exam.  Recent Labs    12/26/23 1837  HGB 10.9*  HCT 31.6*    Assessment/Plan: Plan for discharge tomorrow Routine PP care   LOS: 1 day   Kim Hawkins, CNM 12/27/2023, 10:02 AM

## 2023-12-28 ENCOUNTER — Encounter: Admitting: Licensed Practical Nurse

## 2023-12-28 NOTE — Discharge Instructions (Signed)

## 2023-12-28 NOTE — Progress Notes (Signed)
 Patient discharged home with family. Discharge instructions, when to follow up, and prescriptions reviewed with patient. Patient verbalized understanding. Patient will be escorted out by auxiliary.

## 2024-01-04 ENCOUNTER — Encounter: Admitting: Certified Nurse Midwife

## 2024-01-11 ENCOUNTER — Encounter: Admitting: Obstetrics and Gynecology

## 2024-01-18 ENCOUNTER — Encounter: Admitting: Licensed Practical Nurse

## 2024-02-09 ENCOUNTER — Ambulatory Visit: Admitting: Certified Nurse Midwife

## 2024-02-09 ENCOUNTER — Encounter: Payer: Self-pay | Admitting: Certified Nurse Midwife

## 2024-02-09 DIAGNOSIS — Z23 Encounter for immunization: Secondary | ICD-10-CM

## 2024-02-09 MED ORDER — NORETHIN ACE-ETH ESTRAD-FE 1.5-30 MG-MCG PO TABS: 1.0000 | ORAL_TABLET | Freq: Every day | ORAL | 3 refills | Status: DC

## 2024-02-09 NOTE — Patient Instructions (Signed)
Preventing Cervical Cancer Cervical cancer is an abnormal growth of cells in the cervix. The cervix is the lowest part of the uterus. It connects the vagina and the uterus. Cancer happens when cells become abnormal and start to grow out of control. If cervical cancer is not found early it can spread to other parts of the body (metastasize). Cervical cancer cannot always be prevented, but you can take steps to lower your risk. How can this condition affect me? Cervical cancer may not cause any symptoms at first. However, if it is found early, cervical cancer can be treated effectively. If it is not caught early, over time, the cancer can grow deep into the cervix tissue and spread to other areas, making it harder to treat. Most cases of cervical cancer are caused by a sexually transmitted infection (STI) called human papillomavirus (HPV). One way to reduce your risk of cervical cancer is to take steps to avoid infection with the HPV virus. What can increase my risk? The following factors may make you more likely to develop this condition: Sexual history You have certain things in your sexual history, such as: Having multiple sexual partners or having one sexual partner who has had many sex partners. Not using condoms correctly every time you have sex. Having chlamydia. Having been sexually active before 22 years old. Using birth control pills for a long time. Having had multiple full-term pregnancies. Family or genetic factors Your mother took a medicine called diethylstilbestrol (DES) while pregnant with you, causing you to be exposed to this medicine before birth. Your mother or sister had cervical cancer. Other risk factors You are between 17-65 years old. You have had cancer of the vagina or vulva. You smoke or regularly breathe in secondhand smoke. Your body's defense system (immune system) is weakened. You have a history of abnormal cells (dysplasia) of the cervix. What actions can I  take to prevent cervical cancer? Preventing HPV infection  Ask your health care provider about getting the HPV vaccine. The HPV vaccination series may be given to children starting from 79-38 years old up to age 6 years old. Adults 26-67 years old who were not already vaccinated might choose to get the HPV vaccine depending on their level of risk for HPV. This vaccine protects against the types of HPV that could cause cancer. Limit the number of people you have sex with. Avoid having sex with people who have had many sex partners. Use a a condom every time you have sex. Getting Pap tests Getting regular Pap tests helps identify changes in cells that could lead to cancer. Get Pap tests starting at 22 years old. Talk with your provider about how often you need these tests. Most females who are 62-53 years old should have a Pap test every 3 years. Most females who are 41-50 years old should have a Pap test in combination with an HPV test every 5 years. Females with a higher risk of cervical cancer, such as those with a weakened immune system or those who were exposed to DES before birth, may need more frequent testing. Lifestyle  Do not use any products that contain nicotine or tobacco. These products include cigarettes, chewing tobacco, and vaping devices, such as e-cigarettes. If you need help quitting, ask your provider. Eat a healthy diet that includes at least 5 servings of fruits and vegetables every day. Lose weight if you are overweight. Where to find support Talk with your provider, school nurse, or local health department for  guidance about screening and vaccination. Some children and teens may be able to get the HPV vaccine free of charge through the [place] Vaccines for Children [facility]) program. Other places that provide vaccinations include: Public health clinics. Check with your local health department. [employer]. At these centers you would pay only what you  can afford. To find one near you, go to: http://cox.biz/ [facility]. These are part of a program for Medicare and Medicaid patients who live in rural areas. The National Breast and Cervical Cancer Early Detection Program also provides breast and cervical cancer screenings and diagnostic services to low-income, uninsured, and underinsured females. Cervical cancer can be passed down through families. Talk with your provider or a genetic counselor to learn more about genetic testing for cancer. Where to find more information [employer]: acog.org American Cancer Society: cancer.org Centers for Disease Control and Prevention: TonerPromos.no Contact a health care provider if: You have pelvic pain or pressure. You have leg or back pain. You have unusual discharge or bleeding from your vagina. You have unexplained weight loss. This information is not intended to replace advice given to you by your health care provider. Make sure you discuss any questions you have with your health care provider. Document Revised: 02/02/2022 Document Reviewed: 02/02/2022 Elsevier Patient Education  2024 [employer].

## 2024-02-09 NOTE — Progress Notes (Signed)
 Subjective:    CINA KLUMPP is a 23 y.o. G90P1001 Caucasian female who presents for a postpartum visit. She is 6 weeks postpartum following a spontaneous vaginal delivery at 37 gestational weeks. Anesthesia: none. I have fully reviewed the prenatal and intrapartum course. Postpartum course has been normal. Baby's course has been normal. Baby is feeding by bottle. Bleeding no bleeding. Bowel function is normal. Bladder function is normal. Patient is not sexually active. Last sexual activity: prior to delivery. Contraception method is OCP (estrogen/progesterone). Postpartum depression screening: negative. Score 0.  Last pap has not had one yet d/t age.   The following portions of the patient's history were reviewed and updated as appropriate: allergies, current medications, past medical history, past surgical history and problem list.  Review of Systems Pertinent items are noted in HPI.   There were no vitals filed for this visit. Patient's last menstrual period was 03/13/2023 (exact date).  Objective:   General:  alert, cooperative and no distress   Breasts:  deferred, no complaints  Lungs: clear to auscultation bilaterally  Heart:  regular rate and rhythm  Abdomen: soft, nontender   Vulva: normal  Vagina: normal vagina  Cervix:  closed  Corpus: Well-involuted  Adnexa:  Non-palpable  Rectal Exam: no hemorrhoids        Assessment:   Postpartum exam 6 wks s/p SVD Bottle feeding Depression screening Contraception counseling   Plan:  : OCP (estrogen/progesterone) Follow up in: 3 months for pap or earlier if needed  Zelda Hummer, CNM

## 2024-03-06 ENCOUNTER — Other Ambulatory Visit: Payer: Self-pay

## 2024-03-06 DIAGNOSIS — Z3041 Encounter for surveillance of contraceptive pills: Secondary | ICD-10-CM

## 2024-03-06 MED ORDER — NORETHIN ACE-ETH ESTRAD-FE 1.5-30 MG-MCG PO TABS: 1.0000 | ORAL_TABLET | Freq: Every day | ORAL | 3 refills | Status: AC
